# Patient Record
Sex: Male | Born: 1980 | Race: Black or African American | Hispanic: No | Marital: Married | Smoking: Former smoker
Health system: Southern US, Community
[De-identification: ages and names within clinical notes are randomized; demographics above are authoritative.]

## PROBLEM LIST (undated history)

## (undated) DIAGNOSIS — K3184 Gastroparesis: Secondary | ICD-10-CM

## (undated) DIAGNOSIS — F419 Anxiety disorder, unspecified: Secondary | ICD-10-CM

---

## 2019-11-01 ENCOUNTER — Other Ambulatory Visit: Payer: Self-pay

## 2019-11-01 ENCOUNTER — Ambulatory Visit (HOSPITAL_COMMUNITY)
Admission: EM | Admit: 2019-11-01 | Discharge: 2019-11-01 | Disposition: A | Discharge status: Home / Self Care | Payer: No Typology Code available for payment source | Attending: Family Medicine | Admitting: Family Medicine

## 2019-11-01 ENCOUNTER — Encounter (HOSPITAL_COMMUNITY): Payer: Self-pay | Admitting: Emergency Medicine

## 2019-11-01 ENCOUNTER — Emergency Department (HOSPITAL_COMMUNITY)
Admission: EM | Admit: 2019-11-01 | Discharge: 2019-11-01 | Discharge status: Absent without leave | Payer: Non-veteran care

## 2019-11-01 DIAGNOSIS — K449 Diaphragmatic hernia without obstruction or gangrene: Secondary | ICD-10-CM | POA: Diagnosis not present

## 2019-11-01 DIAGNOSIS — R1114 Bilious vomiting: Secondary | ICD-10-CM | POA: Insufficient documentation

## 2019-11-01 DIAGNOSIS — R1110 Vomiting, unspecified: Secondary | ICD-10-CM

## 2019-11-01 DIAGNOSIS — F419 Anxiety disorder, unspecified: Secondary | ICD-10-CM | POA: Diagnosis not present

## 2019-11-01 HISTORY — DX: Anxiety disorder, unspecified: F41.9

## 2019-11-01 LAB — COMPREHENSIVE METABOLIC PANEL
ALT: 28 U/L (ref 0–44)
AST: 26 U/L (ref 15–41)
Albumin: 4.6 g/dL (ref 3.5–5.0)
Alkaline Phosphatase: 77 U/L (ref 38–126)
Anion gap: 12 (ref 5–15)
BUN: 10 mg/dL (ref 6–20)
CO2: 27 mmol/L (ref 22–32)
Calcium: 9.5 mg/dL (ref 8.9–10.3)
Chloride: 101 mmol/L (ref 98–111)
Creatinine, Ser: 1.22 mg/dL (ref 0.61–1.24)
GFR calc Af Amer: >60 mL/min (ref >60)
GFR calc non Af Amer: >60 mL/min (ref >60)
Glucose, Bld: 127 mg/dL — ABNORMAL HIGH (ref 70–99)
Potassium: 3.9 mmol/L (ref 3.5–5.1)
Sodium: 140 mmol/L (ref 135–145)
Total Bilirubin: 0.9 mg/dL (ref 0.3–1.2)
Total Protein: 7.8 g/dL (ref 6.5–8.1)

## 2019-11-01 LAB — POCT URINALYSIS DIPSTICK, ED / UC
Glucose, UA: NEGATIVE mg/dL
Ketones, ur: 80 mg/dL — AB
Leukocytes,Ua: NEGATIVE
Nitrite: NEGATIVE
Protein, ur: 30 mg/dL — AB
Specific Gravity, Urine: >=1.03 (ref 1.005–1.030)
Urobilinogen, UA: 1 mg/dL (ref 0.0–1.0)
pH: 5.5 (ref 5.0–8.0)

## 2019-11-01 LAB — CBC WITH DIFFERENTIAL/PLATELET
Abs Immature Granulocytes: 0.02 10*3/uL (ref 0.00–0.07)
Basophils Absolute: 0 10*3/uL (ref 0.0–0.1)
Basophils Relative: 0 %
Eosinophils Absolute: 0 10*3/uL (ref 0.0–0.5)
Eosinophils Relative: 0 %
HCT: 43.4 % (ref 39.0–52.0)
Hemoglobin: 15.1 g/dL (ref 13.0–17.0)
Immature Granulocytes: 0 %
Lymphocytes Relative: 15 %
Lymphs Abs: 0.8 10*3/uL (ref 0.7–4.0)
MCH: 31.9 pg (ref 26.0–34.0)
MCHC: 34.8 g/dL (ref 30.0–36.0)
MCV: 91.6 fL (ref 80.0–100.0)
Monocytes Absolute: 0.4 10*3/uL (ref 0.1–1.0)
Monocytes Relative: 7 %
Neutro Abs: 4.2 10*3/uL (ref 1.7–7.7)
Neutrophils Relative %: 78 %
Platelets: 202 10*3/uL (ref 150–400)
RBC: 4.74 MIL/uL (ref 4.22–5.81)
RDW: 13.5 % (ref 11.5–15.5)
WBC: 5.4 10*3/uL (ref 4.0–10.5)
nRBC: 0 % (ref 0.0–0.2)

## 2019-11-01 LAB — HEMOGLOBIN A1C
Hgb A1c MFr Bld: 5.8 % — ABNORMAL HIGH (ref 4.8–5.6)
Mean Plasma Glucose: 119.76 mg/dL

## 2019-11-01 LAB — CBG MONITORING, ED: Glucose-Capillary: 141 mg/dL — ABNORMAL HIGH (ref 70–99)

## 2019-11-01 LAB — LIPASE, BLOOD: Lipase: 21 U/L (ref 11–51)

## 2019-11-01 LAB — AMYLASE: Amylase: 41 U/L (ref 28–100)

## 2019-11-01 MED ORDER — ONDANSETRON 4 MG PO TBDP
ORAL_TABLET | ORAL | Status: AC
  Filled 2019-11-01: qty 1

## 2019-11-01 MED ORDER — ONDANSETRON 4 MG PO TBDP: 4.0000 mg | ORAL_TABLET | Freq: Three times a day (TID) | ORAL | 0 refills | Status: DC | PRN

## 2019-11-01 MED ORDER — FAMOTIDINE 20 MG PO TABS: 20.0000 mg | ORAL_TABLET | Freq: Two times a day (BID) | ORAL | 0 refills | Status: AC

## 2019-11-01 MED ORDER — HYDROXYZINE HCL 25 MG PO TABS: 25.0000 mg | ORAL_TABLET | Freq: Four times a day (QID) | ORAL | 0 refills | Status: AC

## 2019-11-01 MED ORDER — ONDANSETRON 4 MG PO TBDP
4.0000 mg | ORAL_TABLET | Freq: Once | ORAL | Status: AC
  Administered 2019-11-01: 4 mg via ORAL

## 2019-11-01 NOTE — ED Triage Notes (Signed)
Pt presents with vomiting and no appetite xs 2 days. States was tested for COVID two days and tested negative.

## 2019-11-01 NOTE — Discharge Instructions (Signed)
I have given you some medication for reflux and nausea.  Take the Pepcid twice a day as needed.  Zofran for nausea as needed. I am also prescribing hydroxyzine to use for acute anxiety and to help you rest.  Your blood glucose was elevated today.  I am drawing some more blood work and will call you with any abnormal results. Recommend following up with primary care about your hernia

## 2019-11-02 NOTE — ED Provider Notes (Signed)
[facility]    CSN: [id number] Arrival date & time: 11/01/19  7096      History   Chief Complaint Chief Complaint  Patient presents with  . Emesis    HPI Chad Flowers is a 39 y.o. male.   Patient is a 39 year old male who presents today with vomiting and appetite x2 days.  Symptoms have been constant.  Generalized abdominal tenderness.  No diarrhea no fevers.  Attributes the symptoms to increase stress and anxiety.  Has taken Xanax in the past but he does not like the way it makes him feel.  He is under a tremendous amount of stress right now.  Denies any constipation, blood in stools.  Tested negative for Covid 2 days ago.  No cough, chest congestion, sore throat, ear pain.  Has been drinking water but vomited back up.  Has taken Phenergan for in the past which helps.  No recent sick contacts. No alcohol or drug use.  Patient has known hiatal hernia.     Past Medical History:  Diagnosis Date  . Anxiety     There are no problems to display for this patient.   History reviewed. No pertinent surgical history.     Home Medications    Prior to Admission medications   Medication Sig Start Date End Date Taking? Authorizing Provider  ALPRAZolam Prudy Feeler) 1 MG tablet Take 1 mg by mouth at bedtime as needed for anxiety.   Yes [provider]  famotidine (PEPCID) 20 MG tablet Take 1 tablet (20 mg total) by mouth 2 (two) times daily. 11/01/19   Dahlia Byes A, NP  hydrOXYzine (ATARAX/VISTARIL) 25 MG tablet Take 1 tablet (25 mg total) by mouth every 6 (six) hours. 11/01/19   Jarmel Linhardt, Gloris Manchester A, NP  ondansetron (ZOFRAN ODT) 4 MG disintegrating tablet Take 1 tablet (4 mg total) by mouth every 8 (eight) hours as needed for nausea or vomiting. 11/01/19   Janace Aris, NP    Family History Family History  Problem Relation Age of Onset  . Cancer Mother     Social History Social History   Tobacco Use  . Smoking status: Never Smoker  . Smokeless tobacco: Never Used   Substance Use Topics  . Alcohol use: Not on file  . Drug use: Not on file     Allergies   Patient has no allergy information on record.   Review of Systems Review of Systems   Physical Exam Triage Vital Signs ED Triage Vitals  Enc Vitals Group     BP 11/01/19 0834 (!) 138/101     Pulse Rate 11/01/19 0834 84     Resp 11/01/19 0834 18     Temp 11/01/19 0834 98.9 F (37.2 C)     Temp Source 11/01/19 0834 Oral     SpO2 11/01/19 0834 96 %     Weight --      Height --      Head Circumference --      Peak Flow --      Pain Score 11/01/19 0831 0     Pain Loc --      Pain Edu? --      Excl. in GC? --    No data found.  Updated Vital Signs BP (!) 138/101 (BP Location: Left Arm)   Pulse 84   Temp 98.9 F (37.2 C) (Oral)   Resp 18   SpO2 96%   Visual Acuity Right Eye Distance:   Left Eye Distance:  Bilateral Distance:    Right Eye Near:   Left Eye Near:    Bilateral Near:     Physical Exam Vitals and nursing note reviewed.  Constitutional:      General: He is not in acute distress.    Appearance: Normal appearance. He is not toxic-appearing.  HENT:     Head: Normocephalic and atraumatic.     Nose: Nose normal.  Eyes:     Conjunctiva/sclera: Conjunctivae normal.  Pulmonary:     Effort: Pulmonary effort is normal.  Abdominal:     Palpations: Abdomen is soft.     Tenderness: There is no abdominal tenderness.  Musculoskeletal:        General: Normal range of motion.     Cervical back: Normal range of motion.  Skin:    General: Skin is warm and dry.  Neurological:     Mental Status: He is alert.  Psychiatric:        Mood and Affect: Mood normal.      UC Treatments / Results  Labs (all labs ordered are listed, but only abnormal results are displayed) Labs Reviewed  COMPREHENSIVE METABOLIC PANEL - Abnormal; Notable for the following components:      Result Value   Glucose, Bld 127 (*)    All other components within normal limits  HEMOGLOBIN A1C  - Abnormal; Notable for the following components:   Hgb A1c MFr Bld 5.8 (*)    All other components within normal limits  CBG MONITORING, ED - Abnormal; Notable for the following components:   Glucose-Capillary 141 (*)    All other components within normal limits  POCT URINALYSIS DIPSTICK, ED / UC - Abnormal; Notable for the following components:   Bilirubin Urine SMALL (*)    Ketones, ur 80 (*)    Hgb urine dipstick TRACE (*)    Protein, ur 30 (*)    All other components within normal limits  CBC WITH DIFFERENTIAL/PLATELET  LIPASE, BLOOD  AMYLASE    EKG   Radiology No results found.  Procedures Procedures (including critical care time)  Medications Ordered in UC Medications  ondansetron (ZOFRAN-ODT) disintegrating tablet 4 mg (4 mg Oral Given 11/01/19 5208)    Initial Impression / Assessment and Plan / UC Course  I have reviewed the triage vital signs and the nursing notes.  Pertinent labs & imaging results that were available during my care of the patient were reviewed by me and considered in my medical decision making (see chart for details).     Vomiting No abdominal pain or acute abdomen here. Patient believes this to be stress-induced. Could also be related to hiatal hernia.  Zofran given here for nausea and able to drink ginger ale without vomiting. Recommended start Pepcid twice a day  Zofran for nausea, vomiting as needed. Diet precautions given Lab work not terribly concerning today. Pt made aware of results. Patient in the prediabetic range.  Patient made aware of this.  He plans to do some diet changes and follow-up with this Also recommended trying hydroxyzine that is less sedating for anxiety as needed.   Final Clinical Impressions(s) / UC Diagnoses   Final diagnoses:  Bilious vomiting with nausea     Discharge Instructions     I have given you some medication for reflux and nausea.  Take the Pepcid twice a day as needed.  Zofran for nausea as  needed. I am also prescribing hydroxyzine to use for acute anxiety and to help you rest.  Your blood glucose was elevated today.  I am drawing some more blood work and will call you with any abnormal results. Recommend following up with primary care about your hernia     ED Prescriptions    Medication Sig Dispense Auth. Provider   ondansetron (ZOFRAN ODT) 4 MG disintegrating tablet Take 1 tablet (4 mg total) by mouth every 8 (eight) hours as needed for nausea or vomiting. 20 tablet Preet Perrier A, NP   famotidine (PEPCID) 20 MG tablet Take 1 tablet (20 mg total) by mouth 2 (two) times daily. 30 tablet Kiamesha Samet A, NP   hydrOXYzine (ATARAX/VISTARIL) 25 MG tablet Take 1 tablet (25 mg total) by mouth every 6 (six) hours. 12 tablet Jakyle Petrucelli A, NP     PDMP not reviewed this encounter.   Janace Aris, NP 11/02/19 [phone]

## 2020-02-08 ENCOUNTER — Ambulatory Visit (HOSPITAL_COMMUNITY): Payer: Self-pay

## 2020-02-15 ENCOUNTER — Telehealth: Payer: Self-pay | Admitting: Gastroenterology

## 2020-02-15 NOTE — Telephone Encounter (Signed)
Good morning Dr. Orvan Falconer, we received a referral and records from the [facility] for patient to be scheduled for an endoscopy.  He did have a procedure in 2017.  Records will be sent to you.  Can you please review and advise on scheduling?  Thank you.

## 2020-02-16 NOTE — Telephone Encounter (Signed)
Awaiting records.  
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
## 2020-02-23 NOTE — Telephone Encounter (Signed)
Called and left voicemail for patient to call back to schedule office visit.

## 2020-03-01 ENCOUNTER — Encounter: Payer: Self-pay | Admitting: Physician Assistant

## 2020-03-01 NOTE — Telephone Encounter (Signed)
Patient called back and scheduled appointment for 03/20/2020.

## 2020-03-20 ENCOUNTER — Ambulatory Visit (INDEPENDENT_AMBULATORY_CARE_PROVIDER_SITE_OTHER): Payer: No Typology Code available for payment source | Admitting: Physician Assistant

## 2020-03-20 ENCOUNTER — Other Ambulatory Visit: Payer: Self-pay

## 2020-03-20 ENCOUNTER — Encounter: Payer: Self-pay | Admitting: Physician Assistant

## 2020-03-20 VITALS — BP 110/80 | HR 88 | Wt 196.0 lb

## 2020-03-20 DIAGNOSIS — F4310 Post-traumatic stress disorder, unspecified: Secondary | ICD-10-CM

## 2020-03-20 DIAGNOSIS — R112 Nausea with vomiting, unspecified: Secondary | ICD-10-CM | POA: Diagnosis not present

## 2020-03-20 DIAGNOSIS — R1115 Cyclical vomiting syndrome unrelated to migraine: Secondary | ICD-10-CM | POA: Diagnosis not present

## 2020-03-20 DIAGNOSIS — F411 Generalized anxiety disorder: Secondary | ICD-10-CM

## 2020-03-20 DIAGNOSIS — K589 Irritable bowel syndrome without diarrhea: Secondary | ICD-10-CM

## 2020-03-20 DIAGNOSIS — K219 Gastro-esophageal reflux disease without esophagitis: Secondary | ICD-10-CM | POA: Diagnosis not present

## 2020-03-20 MED ORDER — ONDANSETRON HCL 4 MG PO TABS: 4.0000 mg | ORAL_TABLET | Freq: Four times a day (QID) | ORAL | 4 refills | Status: DC | PRN

## 2020-03-20 MED ORDER — PROMETHAZINE HCL 25 MG RE SUPP: 25.0000 mg | Freq: Four times a day (QID) | RECTAL | 4 refills | Status: DC | PRN

## 2020-03-20 MED ORDER — OMEPRAZOLE 40 MG PO CPDR: 40.0000 mg | DELAYED_RELEASE_CAPSULE | Freq: Every morning | ORAL | 4 refills | Status: AC

## 2020-03-20 NOTE — Patient Instructions (Signed)
If you are age 40 or older, your body mass index should be between 23-30. Your There is no height or weight on file to calculate BMI. If this is out of the aforementioned range listed, please consider follow up with your Primary Care Provider.  If you are age 36 or younger, your body mass index should be between 19-25. Your There is no height or weight on file to calculate BMI. If this is out of the aformentioned range listed, please consider follow up with your Primary Care Provider.   You have been scheduled for an endoscopy. Please follow written instructions given to you at your visit today. If you use inhalers (even only as needed), please bring them with you on the day of your procedure.  We have sent promethazine suppositories, ondansetron and Omeprazole to your pharmacy.  Follow a Antr-reflux diet.  Follow up pending at this time.  Thank you for entrusting me with your care and choosing [facility].  Amy Esterwood, PA-C

## 2020-03-21 ENCOUNTER — Encounter: Payer: Self-pay | Admitting: Physician Assistant

## 2020-03-21 DIAGNOSIS — F4310 Post-traumatic stress disorder, unspecified: Secondary | ICD-10-CM | POA: Insufficient documentation

## 2020-03-21 DIAGNOSIS — R1115 Cyclical vomiting syndrome unrelated to migraine: Secondary | ICD-10-CM | POA: Insufficient documentation

## 2020-03-21 DIAGNOSIS — F411 Generalized anxiety disorder: Secondary | ICD-10-CM | POA: Insufficient documentation
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
## 2020-03-22 NOTE — Progress Notes (Signed)
Reviewed.  Jeremi Losito L. Chrislyn Seedorf, MD, MPH  

## 2020-04-23 ENCOUNTER — Other Ambulatory Visit: Payer: Self-pay

## 2020-04-23 ENCOUNTER — Encounter: Payer: Self-pay | Admitting: Gastroenterology

## 2020-04-23 ENCOUNTER — Ambulatory Visit (AMBULATORY_SURGERY_CENTER): Payer: No Typology Code available for payment source | Admitting: Gastroenterology

## 2020-04-23 VITALS — BP 112/67 | HR 74 | Temp 97.5°F | Resp 17 | Ht 71.0 in | Wt 196.0 lb

## 2020-04-23 DIAGNOSIS — K219 Gastro-esophageal reflux disease without esophagitis: Secondary | ICD-10-CM | POA: Diagnosis not present

## 2020-04-23 DIAGNOSIS — K2950 Unspecified chronic gastritis without bleeding: Secondary | ICD-10-CM | POA: Diagnosis not present

## 2020-04-23 DIAGNOSIS — K2970 Gastritis, unspecified, without bleeding: Secondary | ICD-10-CM | POA: Diagnosis not present

## 2020-04-23 DIAGNOSIS — K319 Disease of stomach and duodenum, unspecified: Secondary | ICD-10-CM

## 2020-04-23 MED ORDER — SODIUM CHLORIDE 0.9 % IV SOLN: 500.0000 mL | INTRAVENOUS | Status: DC

## 2020-04-23 NOTE — Progress Notes (Signed)
PT taken to PACU. Monitors in place. VSS. Report given to RN. 

## 2020-04-23 NOTE — Progress Notes (Signed)
Pt's states no medical or surgical changes since previsit or office visit. 

## 2020-04-23 NOTE — Op Note (Signed)
[facility] Patient Name: Chad Flowers Procedure Date: 04/23/2020 10:12 AM MRN: [id number] Endoscopist: Tressia Danas MD, MD Age: 40 Referring MD:  Date of Birth: 02-Mar-1980 Gender: Male Account #: [id number] Procedure:                Upper GI endoscopy Indications:              Nausea with vomiting Medicines:                Monitored Anesthesia Care Procedure:                Pre-Anesthesia Assessment:                           - Prior to the procedure, a History and Physical                            was performed, and patient medications and                            allergies were reviewed. The patient's tolerance of                            previous anesthesia was also reviewed. The risks                            and benefits of the procedure and the sedation                            options and risks were discussed with the patient.                            All questions were answered, and informed consent                            was obtained. Prior Anticoagulants: The patient has                            taken no previous anticoagulant or antiplatelet                            agents. ASA Grade Assessment: II - A patient with                            mild systemic disease. After reviewing the risks                            and benefits, the patient was deemed in                            satisfactory condition to undergo the procedure.                           After obtaining informed consent, the endoscope was  passed under direct vision. Throughout the                            procedure, the patient's blood pressure, pulse, and                            oxygen saturations were monitored continuously. The                            Endoscope was introduced through the mouth, and                            advanced to the third part of duodenum. The upper                            GI endoscopy was accomplished without  difficulty.                            The patient tolerated the procedure well. Scope In: Scope Out: Findings:                 The examined esophagus was normal. the z-line is                            located 35 cm from the incisors. Biopsies were                            obtained from the proximal and distal esophagus                            with cold forceps for histology of suspected                            eosinophilic esophagitis.                           Diffuse mild mucosal changes characterized by an                            increased vascular pattern were found in the entire                            examined stomach. Biopsies were taken with a cold                            forceps for histology. Estimated blood loss was                            minimal.                           The examined duodenum was normal. Biopsies were                            taken with a cold  forceps for histology. Estimated                            blood loss was minimal.                           The cardia and gastric fundus were normal on                            retroflexion.                           The exam was otherwise without abnormality. Complications:            No immediate complications. Estimated blood loss:                            Minimal. Estimated Blood Loss:     Estimated blood loss was minimal. Impression:               - Normal esophagus. Biopsied.                           - Increased vascular pattern mucosa in the stomach.                            Biopsied.                           - Normal examined duodenum. Biopsied.                           - The examination was otherwise normal. Recommendation:           - Patient has a contact number available for                            emergencies. The signs and symptoms of potential                            delayed complications were discussed with the                            patient. Return to  normal activities tomorrow.                            Written discharge instructions were provided to the                            patient.                           - Resume previous diet.                           - Continue present medications including omeprazole  40 mg QAM and Zofran 4mg  q6 ODT PRN nausea/vomiting.                           - Await pathology results.                           - Please call the office to arrange a follow-up                            appointment. Tressia DanasKimberly Dynisha Due MD, MD 04/23/2020 10:28:50 AM This report has been signed electronically.

## 2020-04-23 NOTE — Progress Notes (Signed)
Called to room to assist during endoscopic procedure.  Patient ID and intended procedure confirmed with present staff. Received instructions for my participation in the procedure from the performing physician.  

## 2020-04-23 NOTE — Patient Instructions (Signed)
YOU HAD AN ENDOSCOPIC PROCEDURE TODAY AT THE [facility]:   Refer to the procedure report that was given to you for any specific questions about what was found during the examination.  If the procedure report does not answer your questions, please call your gastroenterologist to clarify.  If you requested that your care partner not be given the details of your procedure findings, then the procedure report has been included in a sealed envelope for you to review at your convenience later.  YOU SHOULD EXPECT: Some feelings of bloating in the abdomen. Passage of more gas than usual.  Walking can help get rid of the air that was put into your GI tract during the procedure and reduce the bloating. If you had a lower endoscopy (such as a colonoscopy or flexible sigmoidoscopy) you may notice spotting of blood in your stool or on the toilet paper. If you underwent a bowel prep for your procedure, you may not have a normal bowel movement for a few days.  Please Note:  You might notice some irritation and congestion in your nose or some drainage.  This is from the oxygen used during your procedure.  There is no need for concern and it should clear up in a day or so.  SYMPTOMS TO REPORT IMMEDIATELY:   Following lower endoscopy (colonoscopy or flexible sigmoidoscopy):  Excessive amounts of blood in the stool  Significant tenderness or worsening of abdominal pains  Swelling of the abdomen that is new, acute  Fever of 100F or higher   Following upper endoscopy (EGD)  Vomiting of blood or coffee ground material  New chest pain or pain under the shoulder blades  Painful or persistently difficult swallowing  New shortness of breath  Fever of 100F or higher  Black, tarry-looking stools  For urgent or emergent issues, a gastroenterologist can be reached at any hour by calling [phone]. Do not use MyChart messaging for urgent concerns.    DIET:  We do recommend a small meal at first, but  then you may proceed to your regular diet.  Drink plenty of fluids but you should avoid alcoholic beverages for 24 hours.  ACTIVITY:  You should plan to take it easy for the rest of today and you should NOT DRIVE or use heavy machinery until tomorrow (because of the sedation medicines used during the test).    FOLLOW UP: Our staff will call the number listed on your records 48-72 hours following your procedure to check on you and address any questions or concerns that you may have regarding the information given to you following your procedure. If we do not reach you, we will leave a message.  We will attempt to reach you two times.  During this call, we will ask if you have developed any symptoms of COVID 19. If you develop any symptoms (ie: fever, flu-like symptoms, shortness of breath, cough etc.) before then, please call [phone].  If you test positive for Covid 19 in the 2 weeks post procedure, please call and report this information to us.    If any biopsies were taken you will be contacted by phone or by letter within the next 1-3 weeks.  Please call us at [phone] if you have not heard about the biopsies in 3 weeks.    SIGNATURES/CONFIDENTIALITY: You and/or your care partner have signed paperwork which will be entered into your electronic medical record.  These signatures attest to the fact that that the information above on   your After Visit Summary has been reviewed and is understood.  Full responsibility of the confidentiality of this discharge information lies with you and/or your care-partner. 

## 2020-04-25 ENCOUNTER — Telehealth: Payer: Self-pay

## 2020-04-25 NOTE — Telephone Encounter (Signed)
Please call the patient tomorrow to see how he is doing and to obtain follow-up if he went to the ED. Thank you.

## 2020-04-25 NOTE — Telephone Encounter (Signed)
  Follow up Call-  Call back number 04/23/2020  Post procedure Call Back phone  # [phone], [phone]  Permission to leave phone message Yes     Patient questions:  Do you have a fever, pain , or abdominal swelling? Yes.   Pain Score  6 *Patient complaining of Left sided chest pain when breathing. Told patient will notify Dr. Orvan Falconer. Recommended to patient to go to ED if not feeling better today.  Have you tolerated food without any problems? Yes.    Have you been able to return to your normal activities? No.  Do you have any questions about your discharge instructions: Diet   No. Medications  No. Follow up visit  No.  Do you have questions or concerns about your Care? No.  Actions: * If pain score is 4 or above: Physician/ provider Notified : Cala Bradford L. Orvan Falconer, MD.

## 2020-04-27 ENCOUNTER — Encounter: Payer: Self-pay | Admitting: Nurse Practitioner

## 2020-04-27 ENCOUNTER — Telehealth: Payer: Self-pay | Admitting: Gastroenterology

## 2020-04-27 ENCOUNTER — Ambulatory Visit (INDEPENDENT_AMBULATORY_CARE_PROVIDER_SITE_OTHER)
Admission: RE | Admit: 2020-04-27 | Discharge: 2020-04-27 | Disposition: A | Discharge status: Home / Self Care | Payer: No Typology Code available for payment source | Source: Ambulatory Visit | Attending: Nurse Practitioner | Admitting: Nurse Practitioner

## 2020-04-27 ENCOUNTER — Ambulatory Visit (INDEPENDENT_AMBULATORY_CARE_PROVIDER_SITE_OTHER): Payer: No Typology Code available for payment source | Admitting: Nurse Practitioner

## 2020-04-27 ENCOUNTER — Other Ambulatory Visit (INDEPENDENT_AMBULATORY_CARE_PROVIDER_SITE_OTHER): Payer: No Typology Code available for payment source

## 2020-04-27 ENCOUNTER — Other Ambulatory Visit: Payer: Self-pay

## 2020-04-27 VITALS — BP 120/76 | HR 96 | Ht 72.25 in | Wt 193.2 lb

## 2020-04-27 DIAGNOSIS — R1012 Left upper quadrant pain: Secondary | ICD-10-CM

## 2020-04-27 DIAGNOSIS — R079 Chest pain, unspecified: Secondary | ICD-10-CM

## 2020-04-27 DIAGNOSIS — R197 Diarrhea, unspecified: Secondary | ICD-10-CM

## 2020-04-27 LAB — CBC WITH DIFFERENTIAL/PLATELET
Basophils Absolute: 0 10*3/uL (ref 0.0–0.1)
Basophils Relative: 0.3 % (ref 0.0–3.0)
Eosinophils Absolute: 0.1 10*3/uL (ref 0.0–0.7)
Eosinophils Relative: 1.8 % (ref 0.0–5.0)
HCT: 39.3 % (ref 39.0–52.0)
Hemoglobin: 13.7 g/dL (ref 13.0–17.0)
Lymphocytes Relative: 16.4 % (ref 12.0–46.0)
Lymphs Abs: 0.8 10*3/uL (ref 0.7–4.0)
MCHC: 34.7 g/dL (ref 30.0–36.0)
MCV: 94.6 fl (ref 78.0–100.0)
Monocytes Absolute: 0.5 10*3/uL (ref 0.1–1.0)
Monocytes Relative: 9.4 % (ref 3.0–12.0)
Neutro Abs: 3.6 10*3/uL (ref 1.4–7.7)
Neutrophils Relative %: 72.1 % (ref 43.0–77.0)
Platelets: 205 10*3/uL (ref 150.0–400.0)
RBC: 4.16 Mil/uL — ABNORMAL LOW (ref 4.22–5.81)
RDW: 13.7 % (ref 11.5–15.5)
WBC: 5 10*3/uL (ref 4.0–10.5)

## 2020-04-27 LAB — COMPREHENSIVE METABOLIC PANEL WITH GFR
ALT: 41 U/L (ref 0–53)
AST: 21 U/L (ref 0–37)
Albumin: 4.1 g/dL (ref 3.5–5.2)
Alkaline Phosphatase: 82 U/L (ref 39–117)
BUN: 12 mg/dL (ref 6–23)
CO2: 32 meq/L (ref 19–32)
Calcium: 9.3 mg/dL (ref 8.4–10.5)
Chloride: 102 meq/L (ref 96–112)
Creatinine, Ser: 1.09 mg/dL (ref 0.40–1.50)
GFR: 85.46 mL/min (ref >60.00)
Glucose, Bld: 99 mg/dL (ref 70–99)
Potassium: 3.6 meq/L (ref 3.5–5.1)
Sodium: 142 meq/L (ref 135–145)
Total Bilirubin: 0.4 mg/dL (ref 0.2–1.2)
Total Protein: 8.3 g/dL (ref 6.0–8.3)

## 2020-04-27 NOTE — Telephone Encounter (Signed)
Pt is being seen today at 2pm by Alcide Evener NP.

## 2020-04-27 NOTE — Progress Notes (Signed)
04/27/2020 Chad Flowers [id number] [birth year]   Chief Complaint: Left chest pain after EGD, diarrhea   History of Present Illness: He presents today for further evaluation post EGD. He underwent an EGD by Dr .Orvan Falconer on 04/23/2020 due to having nausea and vomiting.  He presents to our office today for further evaluation due to having left chest pain with a pleuritic component which started following his EGD procedure.  The EGD showed an increased vascular pattern to the stomach otherwise was normal. Biopsy results are pending. Upon awakening from post EGD he felt a little "disoriented" but by the time he was discharged home he was alert without feeling disoriented and without any chest pain, SOB or abdominal pain. He went home and felt fine for the rest of the day. Later in the evening, he had difficulty sleeping. He developed pain under his left axilla which radiated to his left anterior chest area. He felt like something was stabbing his left lung. Every time he took a breath in he felt like something was puncturing his lung. He went down stairs and slept sitting upright on the couch. He developed a fever 100.50F with sweats at 2am Tues 3/8 and around 6am he went to the bathroom and had dark brown mud like diarrhea. No black stool or rectal bleeding. He continued to have left chest pain worse when he took a breath in. He stated his wife saw an "indentation" to his left chest area. He contacted Dr. Orvan Falconer 3/9 and he was instructed to go to the ED which he did not do. He did not eat any solid food on Monday, Tues or Wednesday. He drank only water. He continued to have dark brown mud like diarrhea 2 to 3 times daily. Thursday 3/10 he tool one Fioricet and he went to work and when busy and walking around he had less left chest pain. During the night his chest pain shifted a bit to the right and mid chest along with localized left mid chest pain. Today, he is having less left chest pain which continues to  somewhat increases with inspiration. No cough or SOB. His last episode of diarrhea was earlier this morning. No further fevers. He is afraid to eat. He ate a [employer] french fry earlier which resulted in more chest discomfort. He is urinating clear yellow urine.   EGD 04/23/2020: - Normal esophagus. Biopsied. - Increased vascular pattern mucosa in the stomach. Biopsied - Normal examined duodenum. Biopsied. - The examination was otherwise normal. - Resume previous diet. - Continue present medications including omeprazole 40 mg QAM and Zofran 4mg  q6 ODT PRN nausea/vomiting. - Await pathology results. - Please call the office to arrange a follow-up appointment.  Current Medications, Allergies, Past Medical History, Past Surgical History, Family History and Social History were reviewed in record.   Review of Systems:   Constitutional: Negative for fever, sweats, chills or weight loss.  Respiratory: Negative for shortness of breath.   Cardiovascular: Negative for chest pain, palpitations and leg swelling.  Gastrointestinal: See HPI.  Musculoskeletal: Negative for back pain or muscle aches.  Neurological: Negative for dizziness, headaches or paresthesias.    Physical Exam: BP 120/76 (BP Location: Left Arm, Patient Position: Sitting, Cuff Size: Normal)   Pulse 96   Ht 6' 0.25" (1.835 m) Comment: height measured without shoes  Wt 193 lb 4 oz (87.7 kg)   BMI 26.03 kg/m  General: Well developed 40 year old male in no acute distress.  Head: Normocephalic and atraumatic. Eyes: No scleral icterus. Conjunctiva pink . Ears: Normal auditory acuity. Lungs: Clear throughout to auscultation. No free air auscultated.  Chest wall: No palpable masses, indentations or crepitus. No superficial chest wall tenderness.  Heart: Regular rate and rhythm, no murmur. Abdomen: Soft, nontender and nondistended. No masses or hepatomegaly. Normal bowel sounds x 4 quadrants.   Rectal: Deferred.  Musculoskeletal: Symmetrical with no gross deformities. Extremities: No edema. Neurological: Alert oriented x 4. No focal deficits.  Psychological: Alert and cooperative. Normal mood and affect  Assessment and Recommendations:  11. 40 year old male with left axilla/left chest pain with a pleuritic component (concerning for a perforation), fever and diarrhea post EGD 04/23/2020. Possible viral process.  He is in no acute distress.  He is hemodynamically stable. -Stat PA/LAT chest xray and abdominal xray 1 view -CBC, CMP -GI pathogen panel  -Diet instructions to be verified after xray results reviewed  -Continue omeprazole and Zofran as previously prescribed -Dr. Orvan Falconer to contact the patient with EGD biopsy results when received -Patient to go to the ED if he develops severe chest pain or any signs of respiratory distress

## 2020-04-27 NOTE — Progress Notes (Signed)
Agree with assessment and plan as outlined. Thank you for seeing him.

## 2020-04-27 NOTE — Patient Instructions (Signed)
If you are age 40 or older, your body mass index should be between 23-30. Your Body mass index is 26.03 kg/m. If this is out of the aforementioned range listed, please consider follow up with your Primary Care Provider.  If you are age 63 or younger, your body mass index should be between 19-25. Your Body mass index is 26.03 kg/m. If this is out of the aformentioned range listed, please consider follow up with your Primary Care Provider.   LABS:  Lab work has been ordered for you today. Our lab is located in the basement. Press "B" on the elevator. The lab is located at the first door on the left as you exit the elevator.  HEALTHCARE LAWS AND MY CHART RESULTS: Due to recent changes in healthcare laws, you may see the results of your imaging and laboratory studies on MyChart before your provider has had a chance to review them.   We understand that in some cases there may be results that are confusing or concerning to you. Not all laboratory results come back in the same time frame and the provider may be waiting for multiple results in order to interpret others.  Please give [place] 48 hours in order for your provider to thoroughly review all the results before contacting the office for clarification of your results.   IMAGING Your provider has requested that you have an chest x ray before leaving today. Please go to the basement floor to our Radiology department for the test.  It was great seeing you today! Thank you for entrusting me with your care and choosing [facility].  Arnaldo Natal, CRNP

## 2020-04-27 NOTE — Telephone Encounter (Signed)
Pt scheduled to see Nat Math NP today at 2pm. Pt aware of appt.

## 2020-04-27 NOTE — Telephone Encounter (Signed)
This patient called around 640 this AM about ongoing LUQ pain. Had an uneventful EGD on 3/7. States the night of that procedure developed pain over his left lower ribs, has persistent since then. States he had a fever the first night but nothing recently. Eating okay, no vomiting, no blood in his stools or lower abdominal pain. Hurts when lying on left side, sharp pain. He denies any musculoskeletal trauma. There is some positional component, sounds perhaps musculoskeletal but hard to say for sure without examining him and doing further evaluation.   Chad Flowers. Not sure if you or APP or any of the other providers have openings to see him for a quick visit today, or we can do basic labs and xray to make sure nothing concerning.   Lavonna Rua, I can't recall which nurse covers Dr. Orvan Falconer, if you can route and call the patient to discuss options. Thanks

## 2020-04-30 NOTE — Progress Notes (Signed)
Reviewed. Thank you for your assistance with his care.   Keyshawn Hellwig L. Orvan Falconer, MD, MPH

## 2020-05-01 ENCOUNTER — Other Ambulatory Visit: Payer: Self-pay

## 2020-05-01 MED ORDER — OMEPRAZOLE 40 MG PO CPDR: 40.0000 mg | DELAYED_RELEASE_CAPSULE | Freq: Two times a day (BID) | ORAL | 6 refills | Status: AC

## 2022-03-08 IMAGING — DX DG ABDOMEN 1V
1 series · 1 of 1 positions shown · non-contrast
Comparison: None.

CLINICAL DATA: Left chest pain, left upper quadrant abdominal pain,
shortness of breath, and diarrhea. Endoscopy earlier this week.

EXAM:
ABDOMEN - 1 VIEW

[abdomen kub]
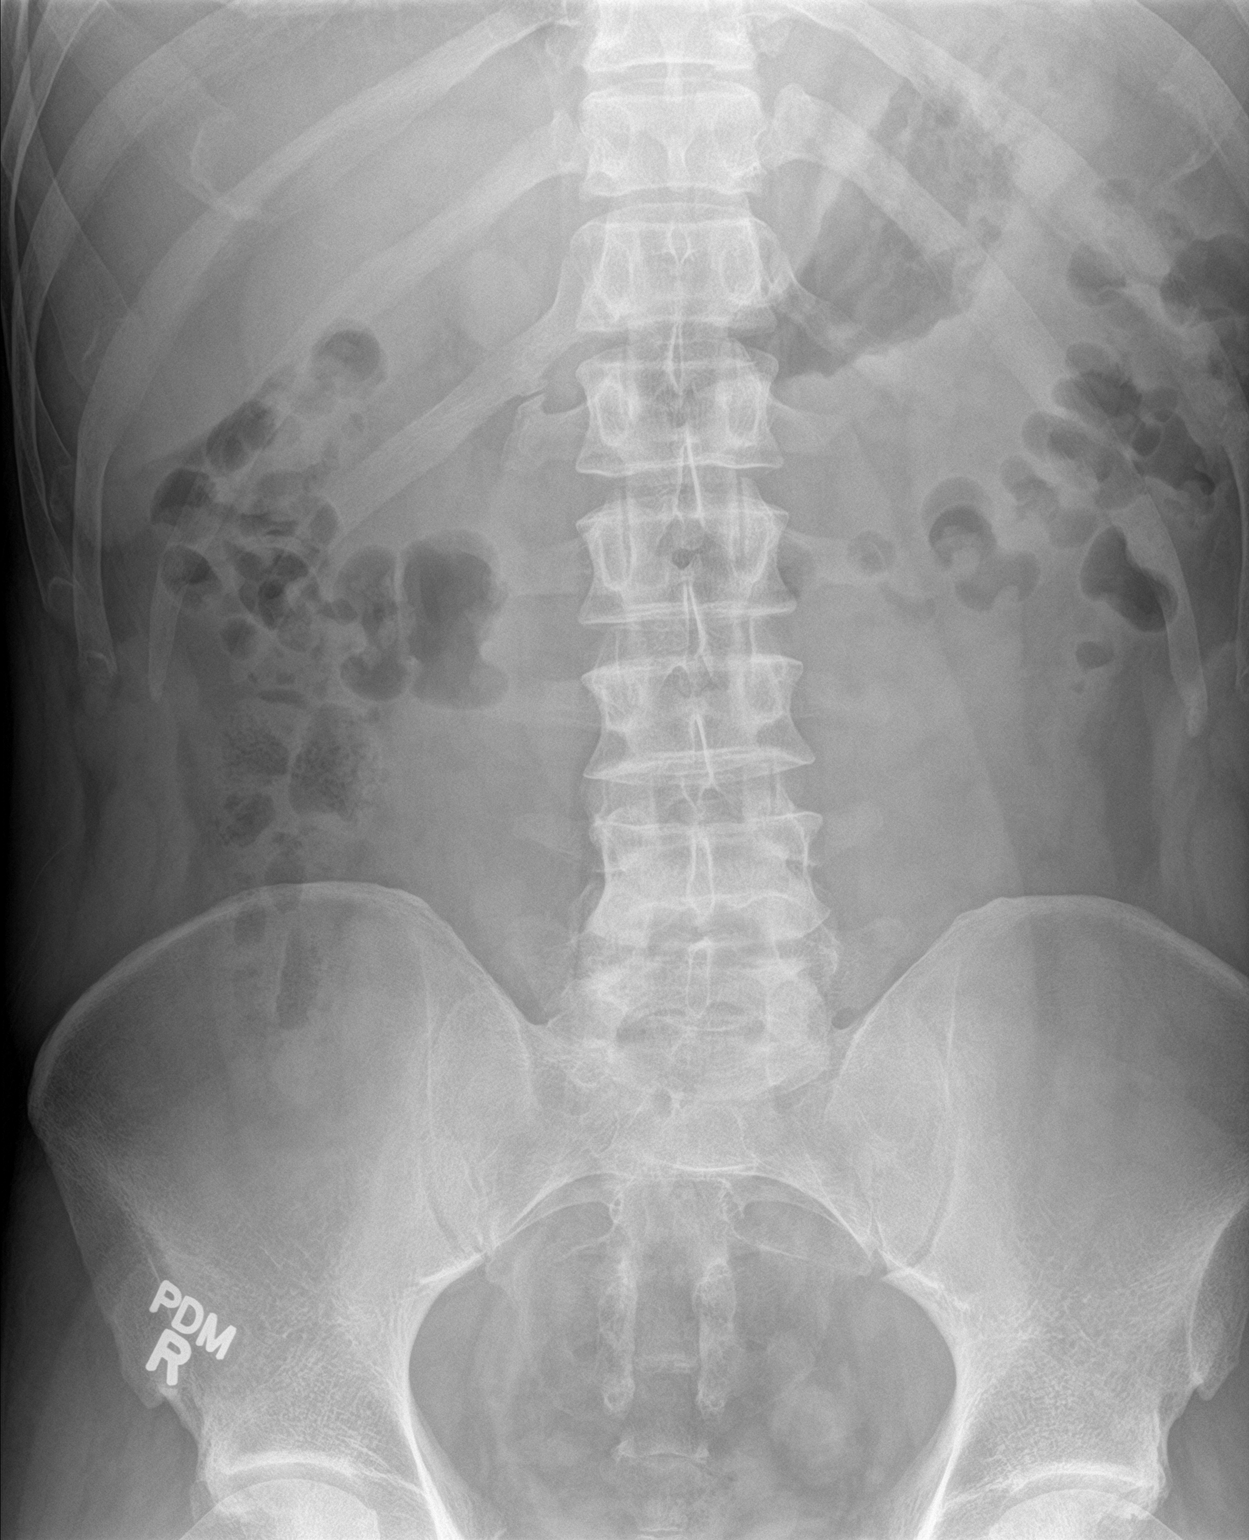

[1 of 1 positions shown; findings below may reference images not displayed]

FINDINGS: Scattered gas and small volume stool are present in the colon, and
there is a small amount of gas in the stomach. No dilated loops of
bowel are seen to suggest obstruction. Assessment for
intraperitoneal free air is limited on this supine study. No acute
osseous abnormality is identified.
IMPRESSION: Negative.

## 2022-03-08 IMAGING — DX DG CHEST 2V
2 series · 2 of 2 positions shown · non-contrast
Comparison: None.

CLINICAL DATA: Left chest pain, left upper quadrant abdominal pain,
shortness of breath, and diarrhea. Endoscopy earlier this week.

EXAM:
CHEST - 2 VIEW

[chest pa]
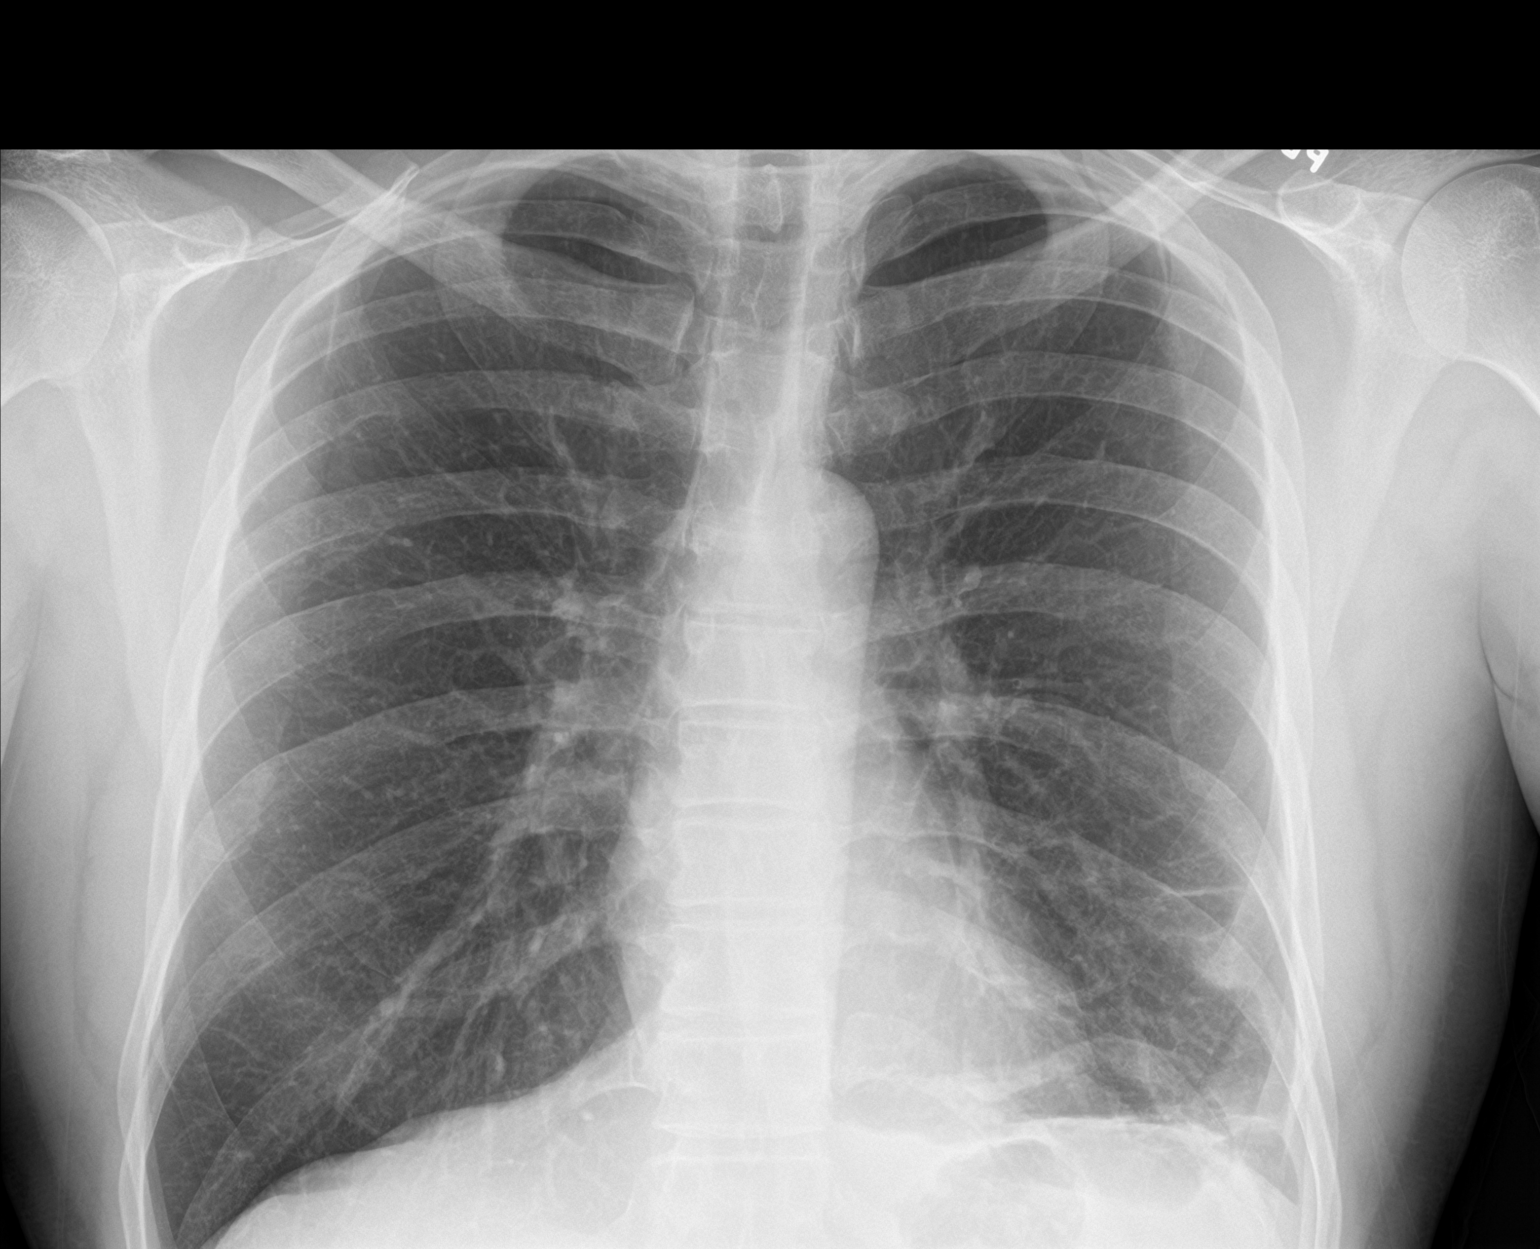

[chest lat]
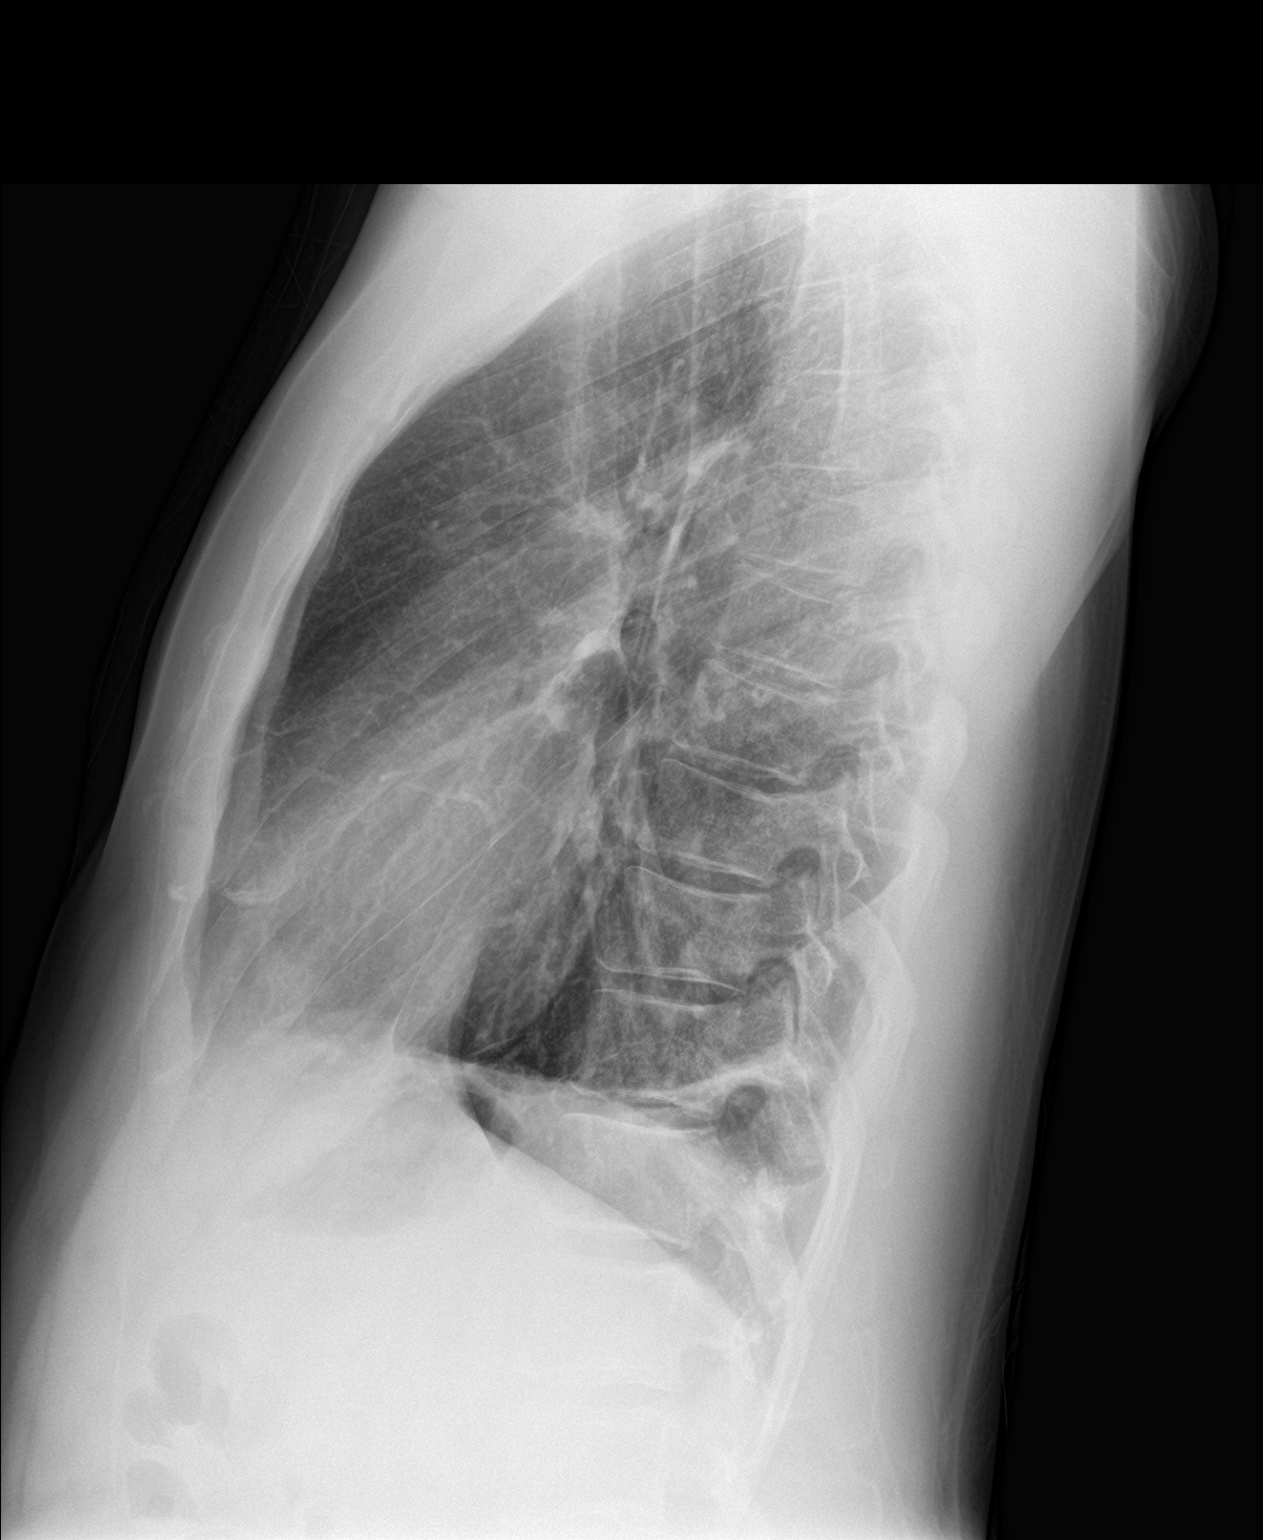

[2 of 2 positions shown; findings below may reference images not displayed]

FINDINGS: The cardiomediastinal silhouette is within normal limits. There are
scattered curvilinear opacities in the left lung base most
suggestive of atelectasis or scarring. The right lung is clear.
There is a small left pleural effusion. No pneumothorax is
identified. No acute osseous abnormality is seen.
IMPRESSION: Small left pleural effusion with left basilar atelectasis or
scarring.

## 2022-04-29 ENCOUNTER — Encounter (HOSPITAL_BASED_OUTPATIENT_CLINIC_OR_DEPARTMENT_OTHER): Payer: Self-pay | Admitting: Emergency Medicine

## 2022-04-29 ENCOUNTER — Emergency Department (HOSPITAL_BASED_OUTPATIENT_CLINIC_OR_DEPARTMENT_OTHER)
Admission: EM | Admit: 2022-04-29 | Discharge: 2022-04-29 | Disposition: A | Discharge status: Home / Self Care | Payer: No Typology Code available for payment source | Attending: Emergency Medicine | Admitting: Emergency Medicine

## 2022-04-29 ENCOUNTER — Other Ambulatory Visit (HOSPITAL_BASED_OUTPATIENT_CLINIC_OR_DEPARTMENT_OTHER): Payer: Self-pay

## 2022-04-29 ENCOUNTER — Other Ambulatory Visit: Payer: Self-pay

## 2022-04-29 DIAGNOSIS — R112 Nausea with vomiting, unspecified: Secondary | ICD-10-CM | POA: Insufficient documentation

## 2022-04-29 DIAGNOSIS — E876 Hypokalemia: Secondary | ICD-10-CM | POA: Diagnosis not present

## 2022-04-29 DIAGNOSIS — F17200 Nicotine dependence, unspecified, uncomplicated: Secondary | ICD-10-CM | POA: Diagnosis not present

## 2022-04-29 DIAGNOSIS — R109 Unspecified abdominal pain: Secondary | ICD-10-CM | POA: Insufficient documentation

## 2022-04-29 DIAGNOSIS — R7989 Other specified abnormal findings of blood chemistry: Secondary | ICD-10-CM | POA: Insufficient documentation

## 2022-04-29 LAB — COMPREHENSIVE METABOLIC PANEL
ALT: 16 U/L (ref 0–44)
AST: 15 U/L (ref 15–41)
Albumin: 4.8 g/dL (ref 3.5–5.0)
Alkaline Phosphatase: 63 U/L (ref 38–126)
Anion gap: 14 (ref 5–15)
BUN: 18 mg/dL (ref 6–20)
CO2: 23 mmol/L (ref 22–32)
Calcium: 9.7 mg/dL (ref 8.9–10.3)
Chloride: 99 mmol/L (ref 98–111)
Creatinine, Ser: 1.25 mg/dL — ABNORMAL HIGH (ref 0.61–1.24)
GFR, Estimated: >60 mL/min (ref >60)
Glucose, Bld: 141 mg/dL — ABNORMAL HIGH (ref 70–99)
Potassium: 3.3 mmol/L — ABNORMAL LOW (ref 3.5–5.1)
Sodium: 136 mmol/L (ref 135–145)
Total Bilirubin: 1.2 mg/dL (ref 0.3–1.2)
Total Protein: 8.2 g/dL — ABNORMAL HIGH (ref 6.5–8.1)

## 2022-04-29 LAB — URINALYSIS, ROUTINE W REFLEX MICROSCOPIC
Bilirubin Urine: NEGATIVE
Glucose, UA: NEGATIVE mg/dL
Hgb urine dipstick: NEGATIVE
Leukocytes,Ua: NEGATIVE
Nitrite: NEGATIVE
Protein, ur: NEGATIVE mg/dL
Specific Gravity, Urine: 1.005 (ref 1.005–1.030)
pH: 5.5 (ref 5.0–8.0)

## 2022-04-29 LAB — CBC
HCT: 42.6 % (ref 39.0–52.0)
Hemoglobin: 15.5 g/dL (ref 13.0–17.0)
MCH: 32.1 pg (ref 26.0–34.0)
MCHC: 36.4 g/dL — ABNORMAL HIGH (ref 30.0–36.0)
MCV: 88.2 fL (ref 80.0–100.0)
Platelets: 196 10*3/uL (ref 150–400)
RBC: 4.83 MIL/uL (ref 4.22–5.81)
RDW: 12.9 % (ref 11.5–15.5)
WBC: 5.3 10*3/uL (ref 4.0–10.5)
nRBC: 0 % (ref 0.0–0.2)

## 2022-04-29 LAB — LIPASE, BLOOD: Lipase: 11 U/L (ref 11–51)

## 2022-04-29 MED ORDER — PROMETHAZINE HCL 25 MG/ML IJ SOLN
INTRAMUSCULAR | Status: AC
  Filled 2022-04-29: qty 1

## 2022-04-29 MED ORDER — LACTATED RINGERS IV BOLUS
1000.0000 mL | Freq: Once | INTRAVENOUS | Status: AC
  Administered 2022-04-29: 1000 mL via INTRAVENOUS

## 2022-04-29 MED ORDER — PROMETHAZINE HCL 25 MG PO TABS
25.0000 mg | ORAL_TABLET | Freq: Four times a day (QID) | ORAL | 0 refills | Status: DC | PRN
  Filled 2022-04-29: qty 30, 8d supply, fill #0

## 2022-04-29 MED ORDER — SODIUM CHLORIDE 0.9 % IV SOLN
25.0000 mg | Freq: Four times a day (QID) | INTRAVENOUS | Status: DC | PRN
  Administered 2022-04-29: 25 mg via INTRAVENOUS
  Filled 2022-04-29: qty 1

## 2022-04-29 NOTE — ED Provider Notes (Signed)
[facility] Provider Note   CSN: [id number] Arrival date & time: 04/29/22  [id number]     History  Chief Complaint  Patient presents with   Abdominal Pain    Chad Flowers is a 42 y.o. male.  HPI 42 year old male history of PTSD, migraine headaches, and intermittent vomiting presents today complaining of vomiting that began on Saturday.  He has vomited 5-15 times per day.  It is nonbloody and nonbilious.  He has taken Lidenza trial at home without relief.  He denies any pain, fever, alcohol use.  He is a smoker.  He reports that this has happened in the past and he associates it with his stress levels.  He reports that previously Phenergan has resolved symptoms.     Home Medications Prior to Admission medications   Medication Sig Start Date End Date Taking? Authorizing Provider  promethazine (PHENERGAN) 25 MG tablet Take 1 tablet (25 mg total) by mouth every 6 (six) hours as needed for nausea or vomiting. 04/29/22  Yes Pattricia Boss, MD  ALPRAZolam Duanne Moron) 1 MG tablet Take 1 mg by mouth at bedtime as needed for anxiety.    [provider]  amitriptyline (ELAVIL) 10 MG tablet Take 20 mg by mouth at bedtime.    [provider]  butalbital-acetaminophen-caffeine (FIORICET) 50-325-40 MG tablet Take 1 tablet by mouth daily as needed. 04/03/22   [provider]  Cholecalciferol (VITAMIN D) 50 MCG (2000 UT) tablet Take 1 tablet by mouth daily. 01/23/22   [provider]  DULoxetine (CYMBALTA) 60 MG capsule Take 60 mg by mouth daily.    [provider]  famotidine (PEPCID) 20 MG tablet Take 1 tablet (20 mg total) by mouth 2 (two) times daily. 11/01/19   Loura Halt A, NP  hydrOXYzine (ATARAX/VISTARIL) 25 MG tablet Take 1 tablet (25 mg total) by mouth every 6 (six) hours. 11/01/19   Loura Halt A, NP  levETIRAcetam (KEPPRA) 250 MG tablet Take 500 mg by mouth 3 (three) times daily.    [provider]   omeprazole (PRILOSEC) 40 MG capsule Take 1 capsule (40 mg total) by mouth in the morning. 03/20/20   Esterwood, Amy S, PA-C  omeprazole (PRILOSEC) 40 MG capsule Take 1 capsule (40 mg total) by mouth in the morning and at bedtime. 05/01/20   Thornton Park, MD  ondansetron (ZOFRAN ODT) 4 MG disintegrating tablet Take 1 tablet (4 mg total) by mouth every 8 (eight) hours as needed for nausea or vomiting. 11/01/19   Bast, Tressia Miners A, NP  ondansetron (ZOFRAN) 4 MG tablet Take 1 tablet (4 mg total) by mouth every 6 (six) hours as needed for nausea or vomiting. Patient not taking: Reported on 04/27/2020 03/20/20   Esterwood, Amy S, PA-C  topiramate (TOPAMAX) 25 MG tablet Take 25 mg by mouth in the morning, at noon, and at bedtime.    [provider]  zolpidem (AMBIEN) 5 MG tablet Take 5 mg by mouth at bedtime as needed for sleep.    [provider]      Allergies    Imitrex [sumatriptan]    Review of Systems   Review of Systems  Physical Exam Updated Vital Signs BP (!) 151/98 (BP Location: Right Arm)   Pulse 96   Temp 98.2 F (36.8 C) (Oral)   Resp 17   SpO2 99%  Physical Exam Vitals and nursing note reviewed.  Constitutional:      Appearance: He is well-developed.  HENT:  Head: Normocephalic and atraumatic.     Right Ear: External ear normal.     Left Ear: External ear normal.     Nose: Nose normal.     Mouth/Throat:     Mouth: Mucous membranes are moist.     Pharynx: Oropharynx is clear.  Eyes:     Extraocular Movements: Extraocular movements intact.     Conjunctiva/sclera: Conjunctivae normal.     Pupils: Pupils are equal, round, and reactive to light.  Cardiovascular:     Rate and Rhythm: Normal rate and regular rhythm.     Heart sounds: Normal heart sounds.  Pulmonary:     Effort: Pulmonary effort is normal. No respiratory distress.     Breath sounds: Normal breath sounds. No wheezing.  Chest:     Chest wall: No tenderness.  Abdominal:     General: Bowel  sounds are normal. There is no distension.     Palpations: Abdomen is soft. There is no mass.     Tenderness: There is no abdominal tenderness. There is no guarding.  Musculoskeletal:        General: Normal range of motion.     Cervical back: Normal range of motion and neck supple.  Skin:    General: Skin is warm and dry.  Neurological:     Mental Status: He is alert and oriented to person, place, and time.     Motor: No abnormal muscle tone.     Coordination: Coordination normal.     Deep Tendon Reflexes: Reflexes are normal and symmetric.  Psychiatric:        Behavior: Behavior normal.        Thought Content: Thought content normal.        Judgment: Judgment normal.     ED Results / Procedures / Treatments   Labs (all labs ordered are listed, but only abnormal results are displayed) Labs Reviewed  COMPREHENSIVE METABOLIC PANEL - Abnormal; Notable for the following components:      Result Value   Potassium 3.3 (*)    Glucose, Bld 141 (*)    Creatinine, Ser 1.25 (*)    Total Protein 8.2 (*)    All other components within normal limits  CBC - Abnormal; Notable for the following components:   MCHC 36.4 (*)    All other components within normal limits  URINALYSIS, ROUTINE W REFLEX MICROSCOPIC - Abnormal; Notable for the following components:   Ketones, ur TRACE (*)    All other components within normal limits  LIPASE, BLOOD    EKG None  Radiology No results found.  Procedures Procedures    Medications Ordered in ED Medications  promethazine (PHENERGAN) 25 mg in sodium chloride 0.9 % 50 mL IVPB (0 mg Intravenous Stopped 04/29/22 0847)  promethazine (PHENERGAN) 25 MG/ML injection (has no administration in time range)  lactated ringers bolus 1,000 mL (1,000 mLs Intravenous New Bag/Given 04/29/22 0842)    ED Course/ Medical Decision Making/ A&P Clinical Course as of 04/29/22 1055  Tue Apr 29, 2022  [id number] Complete metabolic panel reviewed and interpreted significant  for creatinine elevated 1.25 which has increased from baseline of 1.09 but has been as high as 1.22 in the past. [DR]  0914 CBC reviewed interpreted within normal limits Lipase reviewed interpreted within normal limits [DR]  1050 Labs reviewed interpreted significant for mild hypokalemia at 3.3, glucose slightly elevated at 141 and creatinine at 1.25 [DR]    Clinical Course User Index [DR] Pattricia Boss, MD  Medical Decision Making Amount and/or Complexity of Data Reviewed Labs: ordered.   42 year old male presents today complaining of vomiting.  He reports a history of similar episodes in the past.  He received IV fluids and 1 dose of Phenergan and feels greatly improved. He is mildly hypokalemic as his suspect from his vomiting.  We have discussed this in he will orally replenish. He has a slightly elevated creatinine at 1.25 which has been high in the past.  I suspect that that there is some component of volume depletion.  He has been hydrated here in the ED.          Final Clinical Impression(s) / ED Diagnoses Final diagnoses:  Nausea and vomiting, unspecified vomiting type    Rx / DC Orders ED Discharge Orders          Ordered    promethazine (PHENERGAN) 25 MG tablet  Every 6 hours PRN        04/29/22 1054              Pattricia Boss, MD 04/29/22 1055

## 2022-04-29 NOTE — Discharge Instructions (Signed)
Your creatinine and calcium were slightly elevated today.  This is likely due to the vomiting.  Please orally rehydrate as much as possible. Additionally your potassium was slightly low.  This is consistent with your recent vomiting and should correct itself as you are able to tolerate liquids and regular food. Please have your doctor recheck these labs when you are feeling better in the next 2 to 5 days.

## 2022-04-29 NOTE — ED Triage Notes (Signed)
Pt reports abdominal pain and vomiting that started yesterday. Denies fevers.

## 2022-09-30 ENCOUNTER — Emergency Department (HOSPITAL_BASED_OUTPATIENT_CLINIC_OR_DEPARTMENT_OTHER)
Admission: EM | Admit: 2022-09-30 | Discharge: 2022-09-30 | Disposition: A | Discharge status: Home / Self Care | Payer: No Typology Code available for payment source

## 2022-09-30 ENCOUNTER — Other Ambulatory Visit: Payer: Self-pay

## 2022-09-30 ENCOUNTER — Encounter (HOSPITAL_BASED_OUTPATIENT_CLINIC_OR_DEPARTMENT_OTHER): Payer: Self-pay | Admitting: Emergency Medicine

## 2022-09-30 ENCOUNTER — Other Ambulatory Visit (HOSPITAL_BASED_OUTPATIENT_CLINIC_OR_DEPARTMENT_OTHER): Payer: Self-pay

## 2022-09-30 DIAGNOSIS — Z79899 Other long term (current) drug therapy: Secondary | ICD-10-CM | POA: Insufficient documentation

## 2022-09-30 DIAGNOSIS — Z87891 Personal history of nicotine dependence: Secondary | ICD-10-CM | POA: Insufficient documentation

## 2022-09-30 DIAGNOSIS — E876 Hypokalemia: Secondary | ICD-10-CM | POA: Insufficient documentation

## 2022-09-30 DIAGNOSIS — R112 Nausea with vomiting, unspecified: Secondary | ICD-10-CM | POA: Insufficient documentation

## 2022-09-30 DIAGNOSIS — I10 Essential (primary) hypertension: Secondary | ICD-10-CM | POA: Insufficient documentation

## 2022-09-30 HISTORY — DX: Gastroparesis: K31.84

## 2022-09-30 LAB — CBC WITH DIFFERENTIAL/PLATELET
Abs Immature Granulocytes: 0.02 10*3/uL (ref 0.00–0.07)
Basophils Absolute: 0 10*3/uL (ref 0.0–0.1)
Basophils Relative: 0 %
Eosinophils Absolute: 0 10*3/uL (ref 0.0–0.5)
Eosinophils Relative: 0 %
HCT: 43.3 % (ref 39.0–52.0)
Hemoglobin: 15.6 g/dL (ref 13.0–17.0)
Immature Granulocytes: 0 %
Lymphocytes Relative: 16 %
Lymphs Abs: 0.9 10*3/uL (ref 0.7–4.0)
MCH: 32.4 pg (ref 26.0–34.0)
MCHC: 36 g/dL (ref 30.0–36.0)
MCV: 90 fL (ref 80.0–100.0)
Monocytes Absolute: 0.4 10*3/uL (ref 0.1–1.0)
Monocytes Relative: 7 %
Neutro Abs: 4.5 10*3/uL (ref 1.7–7.7)
Neutrophils Relative %: 77 %
Platelets: 179 10*3/uL (ref 150–400)
RBC: 4.81 MIL/uL (ref 4.22–5.81)
RDW: 12.7 % (ref 11.5–15.5)
WBC: 5.9 10*3/uL (ref 4.0–10.5)
nRBC: 0 % (ref 0.0–0.2)

## 2022-09-30 LAB — COMPREHENSIVE METABOLIC PANEL
ALT: 28 U/L (ref 0–44)
AST: 14 U/L — ABNORMAL LOW (ref 15–41)
Albumin: 4.8 g/dL (ref 3.5–5.0)
Alkaline Phosphatase: 74 U/L (ref 38–126)
Anion gap: 11 (ref 5–15)
BUN: 12 mg/dL (ref 6–20)
CO2: 26 mmol/L (ref 22–32)
Calcium: 9.6 mg/dL (ref 8.9–10.3)
Chloride: 102 mmol/L (ref 98–111)
Creatinine, Ser: 1.07 mg/dL (ref 0.61–1.24)
GFR, Estimated: >60 mL/min (ref >60)
Glucose, Bld: 122 mg/dL — ABNORMAL HIGH (ref 70–99)
Potassium: 3.3 mmol/L — ABNORMAL LOW (ref 3.5–5.1)
Sodium: 139 mmol/L (ref 135–145)
Total Bilirubin: 0.9 mg/dL (ref 0.3–1.2)
Total Protein: 8.1 g/dL (ref 6.5–8.1)

## 2022-09-30 LAB — LIPASE, BLOOD: Lipase: 14 U/L (ref 11–51)

## 2022-09-30 MED ORDER — LACTATED RINGERS IV BOLUS
1000.0000 mL | Freq: Once | INTRAVENOUS | Status: AC
  Administered 2022-09-30: 1000 mL via INTRAVENOUS

## 2022-09-30 MED ORDER — ONDANSETRON HCL 4 MG/2ML IJ SOLN
4.0000 mg | Freq: Once | INTRAMUSCULAR | Status: AC
  Administered 2022-09-30: 4 mg via INTRAVENOUS
  Filled 2022-09-30: qty 2

## 2022-09-30 MED ORDER — POTASSIUM CHLORIDE CRYS ER 20 MEQ PO TBCR
40.0000 meq | EXTENDED_RELEASE_TABLET | Freq: Once | ORAL | Status: AC
  Administered 2022-09-30: 40 meq via ORAL
  Filled 2022-09-30: qty 2

## 2022-09-30 MED ORDER — ONDANSETRON 4 MG PO TBDP: 4.0000 mg | ORAL_TABLET | Freq: Three times a day (TID) | ORAL | 0 refills | Status: DC | PRN

## 2022-09-30 NOTE — ED Triage Notes (Signed)
N/v since last Sunday. Pt feels upset. Not keeping anything down.

## 2022-09-30 NOTE — ED Notes (Signed)
Pt d/c home per EDP order. Discharge summary reviewed with pt by charge RN. Pt voicing no complaints at discharge. No s/s of acute distress noted.

## 2022-09-30 NOTE — Discharge Instructions (Signed)
As discussed, workup today overall reassuring.  Laboratory studies relatively unremarkable.  Will recommend bland diet until you are able to tolerate more complex foods.  Will also send in medication for nausea/vomiting.  Recommend follow-up with PCP/GI in the outpatient setting for reevaluation of your symptoms.  Please do not hesitate to return to emergency department for worrisome signs and symptoms we discussed to become apparent.

## 2022-09-30 NOTE — ED Provider Notes (Signed)
[place] EMERGENCY DEPARTMENT AT [facility] Provider Note   CSN: [id number] Arrival date & time: 09/30/22  9528     History  Chief Complaint  Patient presents with   Emesis    Chad Flowers is a 42 y.o. male.  HPI   42 year old male presents emergency department with complaints of nausea, emesis.  Patient states that symptoms began yesterday evening when he was sitting at home.  Patient reports history of similar symptoms in the past for the past 20+ years.  States he was diagnosed with gastroparesis and has had intermittent flareups since diagnosis in 2004.  Has follow-up with GI multiple times in the outpatient setting through the [place] and is currently on no medications for gastroparesis.  Reports some epigastric abdominal pain during bouts of emesis but not experienced otherwise.  Reports several episodes of vomiting since symptom onset and intolerance of anything by mouth.  Denies any fever, chills, hematemesis, urinary symptoms, change in bowel habits, chest pain, shortness of breath.  Reports somewhat regular marijuana use every week or every other week of which he has been using since he was 17.  Past medical history significant for GAD, PTSD, persistent recurrent vomiting  Home Medications Prior to Admission medications   Medication Sig Start Date End Date Taking? Authorizing Provider  atorvastatin (LIPITOR) 10 MG tablet Take 10 mg by mouth daily. 05/06/22 05/07/23 Yes [provider]  ondansetron (ZOFRAN-ODT) 4 MG disintegrating tablet Take 1 tablet (4 mg total) by mouth every 8 (eight) hours as needed. 09/30/22  Yes Sherian Maroon A, PA  ALPRAZolam Prudy Feeler) 1 MG tablet Take 1 mg by mouth at bedtime as needed for anxiety.    [provider]  amitriptyline (ELAVIL) 10 MG tablet Take 20 mg by mouth at bedtime.    [provider]  butalbital-acetaminophen-caffeine (FIORICET) 50-325-40 MG tablet Take 1 tablet by mouth daily as needed. 04/03/22    [provider]  Cholecalciferol (VITAMIN D) 50 MCG (2000 UT) tablet Take 1 tablet by mouth daily. 01/23/22   [provider]  DULoxetine (CYMBALTA) 60 MG capsule Take 60 mg by mouth daily.    [provider]  famotidine (PEPCID) 20 MG tablet Take 1 tablet (20 mg total) by mouth 2 (two) times daily. 11/01/19   Dahlia Byes A, NP  hydrOXYzine (ATARAX/VISTARIL) 25 MG tablet Take 1 tablet (25 mg total) by mouth every 6 (six) hours. 11/01/19   Dahlia Byes A, NP  levETIRAcetam (KEPPRA) 250 MG tablet Take 500 mg by mouth 3 (three) times daily.    [provider]  omeprazole (PRILOSEC) 40 MG capsule Take 1 capsule (40 mg total) by mouth in the morning. 03/20/20   Esterwood, Amy S, PA-C  omeprazole (PRILOSEC) 40 MG capsule Take 1 capsule (40 mg total) by mouth in the morning and at bedtime. 05/01/20   Tressia Danas, MD  topiramate (TOPAMAX) 25 MG tablet Take 25 mg by mouth in the morning, at noon, and at bedtime.    [provider]  zolpidem (AMBIEN) 5 MG tablet Take 5 mg by mouth at bedtime as needed for sleep.    [provider]      Allergies    Imitrex [sumatriptan] and Promethazine    Review of Systems   Review of Systems  All other systems reviewed and are negative.   Physical Exam Updated Vital Signs BP (!) 151/86 (BP Location: Right Arm)   Pulse 77   Temp 99.1 F (37.3 C) (Oral)  Resp 18   SpO2 99%  Physical Exam Vitals and nursing note reviewed.  Constitutional:      General: He is not in acute distress.    Appearance: He is well-developed.  HENT:     Head: Normocephalic and atraumatic.  Eyes:     Conjunctiva/sclera: Conjunctivae normal.  Cardiovascular:     Rate and Rhythm: Normal rate and regular rhythm.     Heart sounds: No murmur heard. Pulmonary:     Effort: Pulmonary effort is normal. No respiratory distress.     Breath sounds: Normal breath sounds. No wheezing, rhonchi or rales.  Abdominal:     Palpations: Abdomen  is soft.     Tenderness: There is no abdominal tenderness. There is no right CVA tenderness, left CVA tenderness or guarding.  Musculoskeletal:        General: No swelling.     Cervical back: Neck supple.  Skin:    General: Skin is warm and dry.     Capillary Refill: Capillary refill takes less than 2 seconds.  Neurological:     Mental Status: He is alert.  Psychiatric:        Mood and Affect: Mood normal.     ED Results / Procedures / Treatments   Labs (all labs ordered are listed, but only abnormal results are displayed) Labs Reviewed  COMPREHENSIVE METABOLIC PANEL - Abnormal; Notable for the following components:      Result Value   Potassium 3.3 (*)    Glucose, Bld 122 (*)    AST 14 (*)    All other components within normal limits  CBC WITH DIFFERENTIAL/PLATELET  LIPASE, BLOOD    EKG None  Radiology No results found.  Procedures Procedures    Medications Ordered in ED Medications  ondansetron (ZOFRAN) injection 4 mg (4 mg Intravenous Given 09/30/22 1023)  lactated ringers bolus 1,000 mL (1,000 mLs Intravenous New Bag/Given 09/30/22 1042)  potassium chloride SA (KLOR-CON M) CR tablet 40 mEq (40 mEq Oral Given 09/30/22 1118)    ED Course/ Medical Decision Making/ A&P                                 Medical Decision Making Amount and/or Complexity of Data Reviewed Labs: ordered.  Risk Prescription drug management.   This patient presents to the ED for concern of nausea, vomiting, this involves an extensive number of treatment options, and is a complaint that carries with it a high risk of complications and morbidity.  The differential diagnosis includes CBD pathology, cholecystitis, pancreatitis, gastritis, PUD, SBO/LBO, volvulus, diverticulitis, appendicitis   Co morbidities that complicate the patient evaluation  See HPI   Additional history obtained:  Additional history obtained from EMR External records from outside source obtained and reviewed  including hospital records   Lab Tests:  I Ordered, and personally interpreted labs.  The pertinent results include: Mild hypokalemia 3.3 otherwise, no electrolyte abnormalities.  No transaminitis.  No renal dysfunction.  No leukocytosis.  No evidence of anemia.  Platelets within range.  Lipase within normal limits.   Imaging Studies ordered:  N/a   Cardiac Monitoring: / EKG:  The patient was maintained on a cardiac monitor.  I personally viewed and interpreted the cardiac monitored which showed an underlying rhythm of: Sinus rhythm   Consultations Obtained:  N/a   Problem List / ED Course / Critical interventions / Medication management  Nausea/vomiting I ordered medication including 1  L of lactated Ringer's, Zofran Reevaluation of the patient after these medicines showed that the patient improved I have reviewed the patients home medicines and have made adjustments as needed   Social Determinants of Health:  Former cigarette use.  Regular marijuana use.  Denies alcohol use.   Test / Admission - Considered:  Nausea/vomiting Vitals signs significant for hypertension blood pressure 153/100. Otherwise within normal range and stable throughout visit. Laboratory/imaging studies significant for: See above 42 year old male presents emergency department with complaints of nausea and vomiting.  On exam, patient initially with no abdominal tenderness to palpation with reported similar symptoms in the past for the past 20 years related to gastroparesis.  Patient's laboratory studies significant for mild hypokalemia of 3.3 but otherwise grossly unremarkable for any acute process..  Serial abdominal exams showed no appreciable abdominal tenderness so CT imaging was foregone.  Patient treated with antiemetic as well as IV fluids with subsequent passage of p.o. challenge.  Further workup deemed unnecessary at this time.  Patient symptoms could be secondary to underlying gastroparesis plus  or minus side effect of regular marijuana use versus possible viral gastroenteritis.  Will recommend continued use of antiemetic in the outpatient setting and follow-up with GI for further assessment/evaluation.  Treatment plan discussed with patient and he acknowledged understanding was agreeable to the plan.  Patient well-appearing, afebrile in no acute distress. Worrisome signs and symptoms were discussed with the patient, and the patient acknowledged understanding to return to the ED if noticed. Patient was stable upon discharge.          Final Clinical Impression(s) / ED Diagnoses Final diagnoses:  Nausea and vomiting, unspecified vomiting type    Rx / DC Orders ED Discharge Orders          Ordered    ondansetron (ZOFRAN-ODT) 4 MG disintegrating tablet  Every 8 hours PRN        09/30/22 1148              Peter Garter, [place] 09/30/22 1154    Coral Spikes, DO 09/30/22 1613

## 2022-10-01 ENCOUNTER — Other Ambulatory Visit: Payer: Self-pay

## 2022-10-01 ENCOUNTER — Encounter (HOSPITAL_BASED_OUTPATIENT_CLINIC_OR_DEPARTMENT_OTHER): Payer: Self-pay | Admitting: Emergency Medicine

## 2022-10-01 ENCOUNTER — Emergency Department (HOSPITAL_BASED_OUTPATIENT_CLINIC_OR_DEPARTMENT_OTHER)
Admission: EM | Admit: 2022-10-01 | Discharge: 2022-10-01 | Disposition: A | Discharge status: Home / Self Care | Payer: No Typology Code available for payment source | Attending: Emergency Medicine | Admitting: Emergency Medicine

## 2022-10-01 DIAGNOSIS — K3184 Gastroparesis: Secondary | ICD-10-CM | POA: Diagnosis not present

## 2022-10-01 DIAGNOSIS — Z1152 Encounter for screening for COVID-19: Secondary | ICD-10-CM | POA: Diagnosis not present

## 2022-10-01 DIAGNOSIS — R112 Nausea with vomiting, unspecified: Secondary | ICD-10-CM | POA: Diagnosis present

## 2022-10-01 LAB — SARS CORONAVIRUS 2 BY RT PCR: SARS Coronavirus 2 by RT PCR: NEGATIVE

## 2022-10-01 MED ORDER — DIPHENHYDRAMINE HCL 50 MG/ML IJ SOLN
25.0000 mg | Freq: Once | INTRAMUSCULAR | Status: AC
  Administered 2022-10-01: 25 mg via INTRAVENOUS
  Filled 2022-10-01: qty 1

## 2022-10-01 MED ORDER — SODIUM CHLORIDE 0.9 % IV BOLUS
1000.0000 mL | Freq: Once | INTRAVENOUS | Status: AC
  Administered 2022-10-01: 1000 mL via INTRAVENOUS

## 2022-10-01 MED ORDER — METOCLOPRAMIDE HCL 10 MG PO TABS: 10.0000 mg | ORAL_TABLET | Freq: Four times a day (QID) | ORAL | 0 refills | Status: AC

## 2022-10-01 MED ORDER — DROPERIDOL 2.5 MG/ML IJ SOLN
2.5000 mg | Freq: Once | INTRAMUSCULAR | Status: AC
  Administered 2022-10-01: 2.5 mg via INTRAVENOUS
  Filled 2022-10-01: qty 2

## 2022-10-01 MED ORDER — METOCLOPRAMIDE HCL 5 MG/ML IJ SOLN
10.0000 mg | Freq: Once | INTRAMUSCULAR | Status: AC
  Administered 2022-10-01: 10 mg via INTRAVENOUS
  Filled 2022-10-01: qty 2

## 2022-10-01 NOTE — ED Provider Notes (Signed)
[place] EMERGENCY DEPARTMENT AT [facility] Provider Note   CSN: [id number] Arrival date & time: 10/01/22  0540     History  Chief Complaint  Patient presents with   Emesis    Chad Flowers is a 42 y.o. male.  HPI     This is a 42 year old male with a history of gastroparesis who presents with nausea, vomiting.  He was seen and evaluated for the same yesterday.  He was treated with Zofran and fluids.  Patient reports he had improvement of symptoms prior to discharge home.  He went home and ate dinner.  However this morning woke up around 455 with dry mouth and immediately began to have nausea and multiple episodes of nonbilious, nonbloody emesis.  Denies abdominal pain.  He states he deals with this fairly frequently although this time his son has also been sick but he seemed to recover.  Reports normal bowel movements.  No diarrhea.  Does report marijuana use in the last 2 weeks.  Is followed at the [place] primarily.  Labs from yesterday were reviewed and largely reassuring.  Home Medications Prior to Admission medications   Medication Sig Start Date End Date Taking? Authorizing Provider  metoCLOPramide (REGLAN) 10 MG tablet Take 1 tablet (10 mg total) by mouth every 6 (six) hours. 10/01/22  Yes Rondel Baton, MD  ALPRAZolam Prudy Feeler) 1 MG tablet Take 1 mg by mouth at bedtime as needed for anxiety.    [provider]  amitriptyline (ELAVIL) 10 MG tablet Take 20 mg by mouth at bedtime.    [provider]  atorvastatin (LIPITOR) 10 MG tablet Take 10 mg by mouth daily. 05/06/22 05/07/23  [provider]  butalbital-acetaminophen-caffeine (FIORICET) 50-325-40 MG tablet Take 1 tablet by mouth daily as needed. 04/03/22   [provider]  Cholecalciferol (VITAMIN D) 50 MCG (2000 UT) tablet Take 1 tablet by mouth daily. 01/23/22   [provider]  DULoxetine (CYMBALTA) 60 MG capsule Take 60 mg by mouth daily.    [provider]   famotidine (PEPCID) 20 MG tablet Take 1 tablet (20 mg total) by mouth 2 (two) times daily. 11/01/19   Dahlia Byes A, NP  hydrOXYzine (ATARAX/VISTARIL) 25 MG tablet Take 1 tablet (25 mg total) by mouth every 6 (six) hours. 11/01/19   Dahlia Byes A, NP  levETIRAcetam (KEPPRA) 250 MG tablet Take 500 mg by mouth 3 (three) times daily.    [provider]  omeprazole (PRILOSEC) 40 MG capsule Take 1 capsule (40 mg total) by mouth in the morning. 03/20/20   Esterwood, Amy S, PA-C  omeprazole (PRILOSEC) 40 MG capsule Take 1 capsule (40 mg total) by mouth in the morning and at bedtime. 05/01/20   Tressia Danas, MD  ondansetron (ZOFRAN-ODT) 4 MG disintegrating tablet Take 1 tablet (4 mg total) by mouth every 8 (eight) hours as needed. 09/30/22   Peter Garter, PA  topiramate (TOPAMAX) 25 MG tablet Take 25 mg by mouth in the morning, at noon, and at bedtime.    [provider]  zolpidem (AMBIEN) 5 MG tablet Take 5 mg by mouth at bedtime as needed for sleep.    [provider]      Allergies    Imitrex [sumatriptan] and Promethazine    Review of Systems   Review of Systems  Respiratory:  Negative for shortness of breath.   Cardiovascular:  Negative for chest pain.  Gastrointestinal:  Positive for nausea and vomiting. Negative for abdominal pain  and diarrhea.  All other systems reviewed and are negative.   Physical Exam Updated Vital Signs BP (!) 96/54   Pulse 66   Temp 97.8 F (36.6 C) (Oral)   Resp 15   Ht 1.829 m (6')   Wt 87.7 kg   SpO2 98%   BMI 26.22 kg/m  Physical Exam Vitals and nursing note reviewed.  Constitutional:      Appearance: He is well-developed.  HENT:     Head: Normocephalic and atraumatic.     Mouth/Throat:     Mouth: Mucous membranes are moist.  Eyes:     Pupils: Pupils are equal, round, and reactive to light.  Cardiovascular:     Rate and Rhythm: Normal rate and regular rhythm.     Heart sounds: Normal heart sounds. No murmur  heard. Pulmonary:     Effort: Pulmonary effort is normal. No respiratory distress.     Breath sounds: Normal breath sounds. No wheezing.  Abdominal:     General: Bowel sounds are normal.     Palpations: Abdomen is soft.     Tenderness: There is no abdominal tenderness. There is no guarding or rebound.  Musculoskeletal:     Cervical back: Neck supple.  Lymphadenopathy:     Cervical: No cervical adenopathy.  Skin:    General: Skin is warm and dry.  Neurological:     Mental Status: He is alert and oriented to person, place, and time.  Psychiatric:        Mood and Affect: Mood normal.     ED Results / Procedures / Treatments   Labs (all labs ordered are listed, but only abnormal results are displayed) Labs Reviewed  SARS CORONAVIRUS 2 BY RT PCR    EKG EKG Interpretation Date/Time:  Wednesday October 01 2022 07:26:44 EDT Ventricular Rate:  74 PR Interval:  143 QRS Duration:  105 QT Interval:  390 QTC Calculation: 433 R Axis:   89  Text Interpretation: Sinus rhythm Nonspecific T abnormalities, diffuse leads Confirmed by Vonita Moss [phone]) on 10/01/2022 7:47:22 AM  Radiology No results found.  Procedures Procedures    Medications Ordered in ED Medications  droperidol (INAPSINE) 2.5 MG/ML injection 2.5 mg (2.5 mg Intravenous Given 10/01/22 0612)  sodium chloride 0.9 % bolus 1,000 mL (0 mLs Intravenous Stopped 10/01/22 0727)  metoCLOPramide (REGLAN) injection 10 mg (10 mg Intravenous Given 10/01/22 0734)  diphenhydrAMINE (BENADRYL) injection 25 mg (25 mg Intravenous Given 10/01/22 0734)    ED Course/ Medical Decision Making/ A&P Clinical Course as of 10/01/22 2334  Wed Oct 01, 2022  9562 Assumed care from Dr. Wilkie Aye.  42 year old male with history of gastroparesis who presents to the emergency department with nausea and vomiting.  Was in the emergency department yesterday and had lab work.  Was given fluids and droperidol today for his gastroparesis.  Does smoke  marijuana occasionally. [RP]  [phone] Patient reassessed after being also given Benadryl and Reglan.  Says vomiting is controlled.  Was able to tolerate small amount of water by mouth.  Patient requesting go home at this time.  Will give him a prescription of Reglan and instructed him to follow-up with his primary doctor in several days. [RP]    Clinical Course User Index [RP] Rondel Baton, MD                                 Medical Decision Making Risk Prescription drug management.  This patient presents to the ED for concern of nausea and vomiting, this involves an extensive number of treatment options, and is a complaint that carries with it a high risk of complications and morbidity.  I considered the following differential and admission for this acute, potentially life threatening condition.  The differential diagnosis includes gastroparesis, cannabinoid hyperemesis, gastroenteritis viral  MDM:    This is a 42 year old male who presents with recurrent nausea and vomiting.  He is nontoxic and vital signs are reassuring.  Seen and evaluated yesterday.  Labs reviewed and were largely reassuring.  He states he has been vomiting his Zofran at home.  He clinically does not appear dry or toxic.  Patient was given droperidol and fluids.  Patient signed out pending symptom control.  (Labs, imaging, consults)  Labs: I Ordered, and personally interpreted labs.  The pertinent results include: None  Imaging Studies ordered: I ordered imaging studies including none I independently visualized and interpreted imaging. I agree with the radiologist interpretation  Additional history obtained from chart review.  External records from outside source obtained and reviewed including prior evaluations  Cardiac Monitoring: The patient was maintained on a cardiac monitor.  If on the cardiac monitor, I personally viewed and interpreted the cardiac monitored which showed an underlying rhythm of: Sinus  rhythm  Reevaluation: After the interventions noted above, I reevaluated the patient and found that they have :stayed the same  Social Determinants of Health:  lives independently  Disposition: Pending  Co morbidities that complicate the patient evaluation  Past Medical History:  Diagnosis Date   Anxiety    Gastroparesis      Medicines Meds ordered this encounter  Medications   droperidol (INAPSINE) 2.5 MG/ML injection 2.5 mg   sodium chloride 0.9 % bolus 1,000 mL   metoCLOPramide (REGLAN) injection 10 mg   diphenhydrAMINE (BENADRYL) injection 25 mg   metoCLOPramide (REGLAN) 10 MG tablet    Sig: Take 1 tablet (10 mg total) by mouth every 6 (six) hours.    Dispense:  15 tablet    Refill:  0    I have reviewed the patients home medicines and have made adjustments as needed  Problem List / ED Course: Problem List Items Addressed This Visit   None Visit Diagnoses     Gastroparesis    -  Primary   Nausea and vomiting, unspecified vomiting type                       Final Clinical Impression(s) / ED Diagnoses Final diagnoses:  Gastroparesis  Nausea and vomiting, unspecified vomiting type    Rx / DC Orders ED Discharge Orders          Ordered    metoCLOPramide (REGLAN) 10 MG tablet  Every 6 hours        10/01/22 0940              Shon Baton, MD 10/01/22 2336

## 2022-10-01 NOTE — Discharge Instructions (Addendum)
You were seen for gastroparesis in the emergency department.  Take the Reglan for your nausea and vomiting and/or Zofran.  Return to the emergency department if you develop any concerning symptoms.  Follow-up with your primary doctor in 2 to 3 days.

## 2022-10-01 NOTE — ED Notes (Signed)
Pt given sprite to PO challenge 

## 2022-10-01 NOTE — ED Triage Notes (Signed)
Pt returns to ED after visit yesterday, c/o constant n/v. Denies any abdominal pain, states he cannot keep zofran down, and has had 10 episodes of emesis in past 24h

## 2022-10-01 NOTE — ED Provider Notes (Signed)
  Physical Exam  BP (!) 96/54   Pulse 66   Temp 97.8 F (36.6 C) (Oral)   Resp 15   Ht 6' (1.829 m)   Wt 87.7 kg   SpO2 98%   BMI 26.22 kg/m   Physical Exam  Procedures  Procedures  ED Course / MDM   Clinical Course as of 10/01/22 1412  Wed Oct 01, 2022  0714 Assumed care from Dr. Wilkie Aye.  42 year old male with history of gastroparesis who presents to the emergency department with nausea and vomiting.  Was in the emergency department yesterday and had lab work.  Was given fluids and droperidol today for his gastroparesis.  Does smoke marijuana occasionally. [RP]  [phone] Patient reassessed after being also given Benadryl and Reglan.  Says vomiting is controlled.  Was able to tolerate small amount of water by mouth.  Patient requesting go home at this time.  Will give him a prescription of Reglan and instructed him to follow-up with his primary doctor in several days. [RP]    Clinical Course User Index [RP] Rondel Baton, MD  ` Medical Decision Making Risk Prescription drug management.      Rondel Baton, MD 10/01/22 [phone]

## 2023-04-09 ENCOUNTER — Encounter (HOSPITAL_BASED_OUTPATIENT_CLINIC_OR_DEPARTMENT_OTHER): Payer: Self-pay | Admitting: Emergency Medicine

## 2023-04-09 ENCOUNTER — Other Ambulatory Visit: Payer: Self-pay

## 2023-04-09 ENCOUNTER — Emergency Department (HOSPITAL_BASED_OUTPATIENT_CLINIC_OR_DEPARTMENT_OTHER)
Admission: EM | Admit: 2023-04-09 | Discharge: 2023-04-09 | Disposition: A | Discharge status: Home / Self Care | Payer: No Typology Code available for payment source | Attending: Emergency Medicine | Admitting: Emergency Medicine

## 2023-04-09 DIAGNOSIS — J101 Influenza due to other identified influenza virus with other respiratory manifestations: Secondary | ICD-10-CM | POA: Insufficient documentation

## 2023-04-09 DIAGNOSIS — R638 Other symptoms and signs concerning food and fluid intake: Secondary | ICD-10-CM | POA: Insufficient documentation

## 2023-04-09 DIAGNOSIS — R112 Nausea with vomiting, unspecified: Secondary | ICD-10-CM | POA: Diagnosis present

## 2023-04-09 DIAGNOSIS — Z20822 Contact with and (suspected) exposure to covid-19: Secondary | ICD-10-CM | POA: Diagnosis not present

## 2023-04-09 LAB — COMPREHENSIVE METABOLIC PANEL
ALT: 36 U/L (ref 0–44)
AST: 18 U/L (ref 15–41)
Albumin: 4.9 g/dL (ref 3.5–5.0)
Alkaline Phosphatase: 65 U/L (ref 38–126)
Anion gap: 18 — ABNORMAL HIGH (ref 5–15)
BUN: 14 mg/dL (ref 6–20)
CO2: 20 mmol/L — ABNORMAL LOW (ref 22–32)
Calcium: 9.4 mg/dL (ref 8.9–10.3)
Chloride: 98 mmol/L (ref 98–111)
Creatinine, Ser: 1.13 mg/dL (ref 0.61–1.24)
GFR, Estimated: >60 mL/min (ref >60)
Glucose, Bld: 157 mg/dL — ABNORMAL HIGH (ref 70–99)
Potassium: 4.1 mmol/L (ref 3.5–5.1)
Sodium: 136 mmol/L (ref 135–145)
Total Bilirubin: 0.9 mg/dL (ref 0.0–1.2)
Total Protein: 8.3 g/dL — ABNORMAL HIGH (ref 6.5–8.1)

## 2023-04-09 LAB — RESP PANEL BY RT-PCR (RSV, FLU A&B, COVID)  RVPGX2
Influenza A by PCR: POSITIVE — AB
Influenza B by PCR: NEGATIVE
Resp Syncytial Virus by PCR: NEGATIVE
SARS Coronavirus 2 by RT PCR: NEGATIVE

## 2023-04-09 LAB — URINALYSIS, ROUTINE W REFLEX MICROSCOPIC
Bilirubin Urine: NEGATIVE
Glucose, UA: NEGATIVE mg/dL
Hgb urine dipstick: NEGATIVE
Ketones, ur: 40 mg/dL — AB
Leukocytes,Ua: NEGATIVE
Nitrite: NEGATIVE
Protein, ur: NEGATIVE mg/dL
Specific Gravity, Urine: 1.009 (ref 1.005–1.030)
pH: 7.5 (ref 5.0–8.0)

## 2023-04-09 LAB — BASIC METABOLIC PANEL
Anion gap: 12 (ref 5–15)
BUN: 12 mg/dL (ref 6–20)
CO2: 23 mmol/L (ref 22–32)
Calcium: 8.6 mg/dL — ABNORMAL LOW (ref 8.9–10.3)
Chloride: 104 mmol/L (ref 98–111)
Creatinine, Ser: 0.96 mg/dL (ref 0.61–1.24)
GFR, Estimated: >60 mL/min (ref >60)
Glucose, Bld: 120 mg/dL — ABNORMAL HIGH (ref 70–99)
Potassium: 4.1 mmol/L (ref 3.5–5.1)
Sodium: 139 mmol/L (ref 135–145)

## 2023-04-09 LAB — CBC
HCT: 43.1 % (ref 39.0–52.0)
Hemoglobin: 15.5 g/dL (ref 13.0–17.0)
MCH: 32.6 pg (ref 26.0–34.0)
MCHC: 36 g/dL (ref 30.0–36.0)
MCV: 90.7 fL (ref 80.0–100.0)
Platelets: 150 10*3/uL (ref 150–400)
RBC: 4.75 MIL/uL (ref 4.22–5.81)
RDW: 12.4 % (ref 11.5–15.5)
WBC: 4.6 10*3/uL (ref 4.0–10.5)
nRBC: 0 % (ref 0.0–0.2)

## 2023-04-09 LAB — LIPASE, BLOOD: Lipase: 14 U/L (ref 11–51)

## 2023-04-09 MED ORDER — SODIUM CHLORIDE 0.9 % IV BOLUS
1000.0000 mL | Freq: Once | INTRAVENOUS | Status: AC
  Administered 2023-04-09: 1000 mL via INTRAVENOUS

## 2023-04-09 MED ORDER — DROPERIDOL 2.5 MG/ML IJ SOLN
1.2500 mg | Freq: Once | INTRAMUSCULAR | Status: AC
  Administered 2023-04-09: 1.25 mg via INTRAVENOUS
  Filled 2023-04-09: qty 2

## 2023-04-09 MED ORDER — ONDANSETRON 4 MG PO TBDP: 4.0000 mg | ORAL_TABLET | Freq: Three times a day (TID) | ORAL | 0 refills | Status: AC | PRN

## 2023-04-09 NOTE — ED Notes (Signed)
 Pt given discharge instructions and reviewed prescriptions. Opportunities given for questions. Pt verbalizes understanding. PIV removed x1. Jillyn Hidden, RN

## 2023-04-09 NOTE — ED Provider Notes (Signed)
 [place] EMERGENCY DEPARTMENT AT [facility] Provider Note   CSN: [id number] Arrival date & time: 04/09/23  4098     History  Chief Complaint  Patient presents with   Emesis    Chad Flowers is a 43 y.o. male.   Emesis  Patient is a 43 year old male with past medical history significant for PTSD, anxiety, gastroparesis  Patient presents to the emergency room today with complaints of nausea vomiting diarrhea fatigue fevers body aches.  Denies any chest pain or difficulty breathing.  Does not feel that his symptoms are related to marijuana use.    Home Medications Prior to Admission medications   Medication Sig Start Date End Date Taking? Authorizing Provider  ondansetron (ZOFRAN-ODT) 4 MG disintegrating tablet Take 1 tablet (4 mg total) by mouth every 8 (eight) hours as needed for nausea or vomiting. 04/09/23  Yes Thailyn Khalid S, PA  ALPRAZolam (XANAX) 1 MG tablet Take 1 mg by mouth at bedtime as needed for anxiety.    [provider]  amitriptyline (ELAVIL) 10 MG tablet Take 20 mg by mouth at bedtime.    [provider]  atorvastatin (LIPITOR) 10 MG tablet Take 10 mg by mouth daily. 05/06/22 05/07/23  [provider]  butalbital-acetaminophen-caffeine (FIORICET) 50-325-40 MG tablet Take 1 tablet by mouth daily as needed. 04/03/22   [provider]  Cholecalciferol (VITAMIN D) 50 MCG (2000 UT) tablet Take 1 tablet by mouth daily. 01/23/22   [provider]  DULoxetine (CYMBALTA) 60 MG capsule Take 60 mg by mouth daily.    [provider]  famotidine (PEPCID) 20 MG tablet Take 1 tablet (20 mg total) by mouth 2 (two) times daily. 11/01/19   Dahlia Byes A, FNP  hydrOXYzine (ATARAX/VISTARIL) 25 MG tablet Take 1 tablet (25 mg total) by mouth every 6 (six) hours. 11/01/19   Dahlia Byes A, FNP  levETIRAcetam (KEPPRA) 250 MG tablet Take 500 mg by mouth 3 (three) times daily.    [provider]  metoCLOPramide (REGLAN)  10 MG tablet Take 1 tablet (10 mg total) by mouth every 6 (six) hours. 10/01/22   Rondel Baton, MD  omeprazole (PRILOSEC) 40 MG capsule Take 1 capsule (40 mg total) by mouth in the morning. 03/20/20   Esterwood, Amy S, PA-C  omeprazole (PRILOSEC) 40 MG capsule Take 1 capsule (40 mg total) by mouth in the morning and at bedtime. 05/01/20   Tressia Danas, MD  topiramate (TOPAMAX) 25 MG tablet Take 25 mg by mouth in the morning, at noon, and at bedtime.    [provider]  zolpidem (AMBIEN) 5 MG tablet Take 5 mg by mouth at bedtime as needed for sleep.    [provider]      Allergies    Imitrex [sumatriptan] and Promethazine    Review of Systems   Review of Systems  Gastrointestinal:  Positive for vomiting.    Physical Exam Updated Vital Signs BP (!) 148/93 (BP Location: Right Arm)   Pulse 96   Temp 98.2 F (36.8 C) (Oral)   Resp 17   Wt 73 kg   SpO2 100%   BMI 21.84 kg/m  Physical Exam Vitals and nursing note reviewed.  Constitutional:      General: He is not in acute distress. HENT:     Head: Normocephalic and atraumatic.     Nose: Nose normal.     Mouth/Throat:     Mouth: Mucous membranes are dry.  Eyes:  General: No scleral icterus. Cardiovascular:     Rate and Rhythm: Normal rate and regular rhythm.     Pulses: Normal pulses.     Heart sounds: Normal heart sounds.  Pulmonary:     Effort: Pulmonary effort is normal. No respiratory distress.     Breath sounds: No wheezing.  Abdominal:     Palpations: Abdomen is soft.     Tenderness: There is no abdominal tenderness. There is no guarding or rebound.  Musculoskeletal:     Cervical back: Normal range of motion.     Right lower leg: No edema.     Left lower leg: No edema.  Skin:    General: Skin is warm and dry.     Capillary Refill: Capillary refill takes less than 2 seconds.  Neurological:     Mental Status: He is alert. Mental status is at baseline.  Psychiatric:        Mood and  Affect: Mood normal.        Behavior: Behavior normal.     ED Results / Procedures / Treatments   Labs (all labs ordered are listed, but only abnormal results are displayed) Labs Reviewed  RESP PANEL BY RT-PCR (RSV, FLU A&B, COVID)  RVPGX2 - Abnormal; Notable for the following components:      Result Value   Influenza A by PCR POSITIVE (*)    All other components within normal limits  COMPREHENSIVE METABOLIC PANEL - Abnormal; Notable for the following components:   CO2 20 (*)    Glucose, Bld 157 (*)    Total Protein 8.3 (*)    Anion gap 18 (*)    All other components within normal limits  URINALYSIS, ROUTINE W REFLEX MICROSCOPIC - Abnormal; Notable for the following components:   Ketones, ur 40 (*)    All other components within normal limits  BASIC METABOLIC PANEL - Abnormal; Notable for the following components:   Glucose, Bld 120 (*)    Calcium 8.6 (*)    All other components within normal limits  LIPASE, BLOOD  CBC    EKG None  Radiology No results found.  Procedures Procedures    Medications Ordered in ED Medications  droperidol (INAPSINE) 2.5 MG/ML injection 1.25 mg (1.25 mg Intravenous Given 04/09/23 1015)  sodium chloride 0.9 % bolus 1,000 mL (1,000 mLs Intravenous New Bag/Given 04/09/23 1610)    ED Course/ Medical Decision Making/ A&P                                 Medical Decision Making Amount and/or Complexity of Data Reviewed Labs: ordered.  Risk Prescription drug management.   This patient presents to the ED for concern of nausea vomiting, this involves a number of treatment options, and is a complaint that carries with it a moderate risk of complications and morbidity. A differential diagnosis was considered for the patient's symptoms which is discussed below:   The emergent differential diagnosis for vomiting includes, but is not limited to ACS/MI, Boerhaave's, DKA, Intracranial Hemorrhage, Ischemic bowel, Meningitis, Sepsis, Acute gastric  dilation, Acetaminophen toxicity, Adrenal insufficiency, Appendicitis, Aspirin toxicity, Bowel obstruction/ileus, Cholecystitis, CNS tumor. Electrolyte abnormalities, Elevated ICP, Gastric outlet obstruction, Hyperemesis gravidarum, Pancreatitis, Peritonitis, Ruptured viscus, Testicular torsion/ovarian torsion, Biliary colic, Cannabinoid hyperemesis syndrome, Disulfiram effect, ETOH, Gastritis, Gastroenteritis, Gastroparesis, Hepatitis, Ibuprofen, Labyrinthitis, Migraine, Motion sickness, Narcotic withdrawal, Thyroid, Pregnancy, Peptic ulcer disease, Renal colic, and UTI    Co morbidities: Discussed in  HPI   Brief History:  Patient is a 43 year old male with past medical history significant for PTSD, anxiety, gastroparesis  Patient presents to the emergency room today with complaints of nausea vomiting diarrhea fatigue fevers body aches.  Denies any chest pain or difficulty breathing.  Does not feel that his symptoms are related to marijuana use.    EMR reviewed including pt PMHx, past surgical history and past visits to ER.   See HPI for more details   Lab Tests:  Initial CMP with anion gap of 18 bicarb of 20 and patient does appear dehydrated will hydrate and recheck.  On recheck BMP is normal.  Urinalysis with ketones but now tolerating p.o.  CBC NML  FLU +  Imaging Studies:  No imaging studies ordered for this patient    Cardiac Monitoring:  The patient was maintained on a cardiac monitor.  I personally viewed and interpreted the cardiac monitored which showed an underlying rhythm of: NSR NA   Medicines ordered:  I ordered medication including droperidol, 1L NS  for hydration Reevaluation of the patient after these medicines showed that the patient improved I have reviewed the patients home medicines and have made adjustments as needed   Critical Interventions:     Consults/Attending Physician      Reevaluation:  After the interventions noted above  I re-evaluated patient and found that they have :improved   Social Determinants of Health:      Problem List / ED Course:  Patient is a 43 year old male present emergency room today with viral symptoms and nausea vomiting ultimately found to have influenza A.  There is certainly some possibility that there may be some cannabinol hyperemesis at play as well.  Patient responded well to droperidol initially had a mild anion gap with decreased bicarb concerning for contraction acidosis however BMP after fluids normal.  Discharge instructions provided to patient.  Return precautions provided.    Dispostion:  After consideration of the diagnostic results and the patients response to treatment, I feel that the patent would benefit from discharge.    Final Clinical Impression(s) / ED Diagnoses Final diagnoses:  Influenza A  Nausea and vomiting, unspecified vomiting type    Rx / DC Orders ED Discharge Orders          Ordered    ondansetron (ZOFRAN-ODT) 4 MG disintegrating tablet  Every 8 hours PRN        04/09/23 1137              Blanchie Dessert [place], [place] 04/09/23 1554    Rozelle Logan, DO 04/10/23 0732

## 2023-04-09 NOTE — ED Triage Notes (Signed)
 Pt reports n/v/d with fever starting last night. Child dx with flu and strep

## 2023-04-09 NOTE — Discharge Instructions (Signed)
 You tested positive for influenza A.  Make sure you drink plenty of water, take Tylenol and ibuprofen as discussed below.  Zofran take 1 tablet at 3 PM and 1 tablet at 9PM or 10 PM after that take as needed.  Please use Tylenol or ibuprofen for pain.  You may use 600 mg ibuprofen every 6 hours or 1000 mg of Tylenol every 6 hours.  You may choose to alternate between the 2.  This would be most effective.  Not to exceed 4 g of Tylenol within 24 hours.  Not to exceed 3200 mg ibuprofen 24 hours.
